# Patient Record
Sex: Female | Born: 1990 | Race: Black or African American | Hispanic: No | Smoking: Never smoker
Health system: Southern US, Community
[De-identification: ages and names within clinical notes are randomized; demographics above are authoritative.]

## PROBLEM LIST (undated history)

## (undated) DIAGNOSIS — N920 Excessive and frequent menstruation with regular cycle: Secondary | ICD-10-CM

## (undated) HISTORY — PX: OTHER SURGICAL HISTORY: SHX169

## (undated) HISTORY — PX: NO PAST SURGERIES: SHX2092

---
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
## 2010-07-30 ENCOUNTER — Emergency Department: Payer: Self-pay | Admitting: Emergency Medicine

## 2011-02-18 DIAGNOSIS — N920 Excessive and frequent menstruation with regular cycle: Secondary | ICD-10-CM

## 2011-02-18 HISTORY — DX: Excessive and frequent menstruation with regular cycle: N92.0

## 2014-05-27 ENCOUNTER — Emergency Department
Admit: 2014-05-27 | Disposition: A | Discharge status: Home / Self Care | Payer: Self-pay | Admitting: Emergency Medicine

## 2014-05-27 LAB — COMPREHENSIVE METABOLIC PANEL
ALBUMIN: 4.2 g/dL
ALT: 29 U/L
ANION GAP: 8 (ref 7–16)
Alkaline Phosphatase: 66 U/L
BILIRUBIN TOTAL: 0.7 mg/dL
BUN: 14 mg/dL
CO2: 25 mmol/L
CREATININE: 0.65 mg/dL
Calcium, Total: 9.3 mg/dL
Chloride: 105 mmol/L
EGFR (African American): >60
EGFR (Non-African Amer.): >60
GLUCOSE: 79 mg/dL
POTASSIUM: 3.8 mmol/L
SGOT(AST): 14 U/L — ABNORMAL LOW
Sodium: 138 mmol/L
TOTAL PROTEIN: 7.5 g/dL

## 2014-05-27 LAB — CBC
HCT: 45.3 % (ref 35.0–47.0)
HGB: 14.7 g/dL (ref 12.0–16.0)
MCH: 29.5 pg (ref 26.0–34.0)
MCHC: 32.5 g/dL (ref 32.0–36.0)
MCV: 91 fL (ref 80–100)
PLATELETS: 314 10*3/uL (ref 150–440)
RBC: 4.99 10*6/uL (ref 3.80–5.20)
RDW: 12.7 % (ref 11.5–14.5)
WBC: 10.4 10*3/uL (ref 3.6–11.0)

## 2014-05-27 LAB — URINALYSIS, COMPLETE
Bacteria: NONE SEEN
Bilirubin,UR: NEGATIVE
Blood: NEGATIVE
Glucose,UR: NEGATIVE mg/dL (ref 0–75)
LEUKOCYTE ESTERASE: NEGATIVE
NITRITE: NEGATIVE
PH: 5 (ref 4.5–8.0)
PROTEIN: NEGATIVE
SPECIFIC GRAVITY: 1.027 (ref 1.003–1.030)

## 2014-05-27 LAB — LIPASE, BLOOD: Lipase: 46 U/L

## 2015-03-22 ENCOUNTER — Emergency Department
Admission: EM | Admit: 2015-03-22 | Discharge: 2015-03-22 | Disposition: A | Discharge status: Home / Self Care | Payer: Self-pay | Attending: Emergency Medicine | Admitting: Emergency Medicine

## 2015-03-22 ENCOUNTER — Encounter: Payer: Self-pay | Admitting: Emergency Medicine

## 2015-03-22 DIAGNOSIS — F17200 Nicotine dependence, unspecified, uncomplicated: Secondary | ICD-10-CM | POA: Insufficient documentation

## 2015-03-22 DIAGNOSIS — Y998 Other external cause status: Secondary | ICD-10-CM | POA: Insufficient documentation

## 2015-03-22 DIAGNOSIS — T23102A Burn of first degree of left hand, unspecified site, initial encounter: Secondary | ICD-10-CM

## 2015-03-22 DIAGNOSIS — X12XXXA Contact with other hot fluids, initial encounter: Secondary | ICD-10-CM | POA: Insufficient documentation

## 2015-03-22 DIAGNOSIS — Y9389 Activity, other specified: Secondary | ICD-10-CM | POA: Insufficient documentation

## 2015-03-22 DIAGNOSIS — T23162A Burn of first degree of back of left hand, initial encounter: Secondary | ICD-10-CM | POA: Insufficient documentation

## 2015-03-22 DIAGNOSIS — T23172A Burn of first degree of left wrist, initial encounter: Secondary | ICD-10-CM | POA: Insufficient documentation

## 2015-03-22 DIAGNOSIS — T22112A Burn of first degree of left forearm, initial encounter: Secondary | ICD-10-CM | POA: Insufficient documentation

## 2015-03-22 DIAGNOSIS — Y9289 Other specified places as the place of occurrence of the external cause: Secondary | ICD-10-CM | POA: Insufficient documentation

## 2015-03-22 LAB — HM PAP SMEAR: HM Pap smear: NEGATIVE

## 2015-03-22 MED ORDER — IBUPROFEN 600 MG PO TABS: 600.0000 mg | ORAL_TABLET | Freq: Once | ORAL | Status: DC

## 2015-03-22 MED ORDER — SILVER SULFADIAZINE 1 % EX CREA
TOPICAL_CREAM | Freq: Once | CUTANEOUS | Status: AC
  Administered 2015-03-22: 08:00:00 via TOPICAL
  Filled 2015-03-22: qty 85

## 2015-03-22 MED ORDER — HYDROCODONE-ACETAMINOPHEN 5-325 MG PO TABS: 1.0000 | ORAL_TABLET | ORAL | Status: DC | PRN

## 2015-03-22 MED ORDER — IBUPROFEN 800 MG PO TABS: 800.0000 mg | ORAL_TABLET | Freq: Three times a day (TID) | ORAL | Status: DC

## 2015-03-22 MED ORDER — IBUPROFEN 600 MG PO TABS
600.0000 mg | ORAL_TABLET | Freq: Once | ORAL | Status: AC
  Administered 2015-03-22: 600 mg via ORAL
  Filled 2015-03-22: qty 1

## 2015-03-22 NOTE — Discharge Instructions (Signed)
Burn Care Burns hurt your skin. When your skin is hurt, it is easier to get an infection. Follow your doctor's directions to help prevent an infection. HOME CARE  Wash your hands well before you change your bandage.  Change your bandage as often as told by your doctor.  Remove the old bandage. If the bandage sticks, soak it off with cool, clean water.  Gently clean the burn with mild soap and water.  Pat the burn dry with a clean, dry cloth.  Put a thin layer of medicated cream on the burn.  Put a clean bandage on as told by your doctor.  Keep the bandage clean and dry.  Raise (elevate) the burn for the first 24 hours. After that, follow your doctor's directions.  Only take medicine as told by your doctor. GET HELP RIGHT AWAY IF:   You have too much pain.  The skin near the burn is red, tender, puffy (swollen), or has red streaks.  The burn area has yellowish white fluid (pus) or a bad smell coming from it.  You have a fever. MAKE SURE YOU:   Understand these instructions.  Will watch your condition.  Will get help right away if you are not doing well or get worse.   This information is not intended to replace advice given to you by your health care provider. Make sure you discuss any questions you have with your health care provider.   Document Released: 11/13/2007 Document Revised: 04/28/2011 Document Reviewed: 06/26/2010 Elsevier Interactive Patient Education 2016 [employer].   Begin taking ibuprofen with food 3 times a day for inflammation. Take Norco if needed for pain. Your burn will need to be reassessed in 24 hours. You will need to follow-up with [facility] clinic or return to the emergency room for evaluation and redressing. Keep bandage clean and dry.
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
## 2015-03-22 NOTE — ED Notes (Signed)
Reddened forearm mid arm down to wrist.  No blistering noted.  Cool wet cloth applied.

## 2015-03-23 ENCOUNTER — Encounter: Payer: Self-pay | Admitting: Emergency Medicine

## 2015-03-23 ENCOUNTER — Emergency Department
Admission: EM | Admit: 2015-03-23 | Discharge: 2015-03-23 | Disposition: A | Discharge status: Home / Self Care | Payer: Self-pay | Attending: Emergency Medicine | Admitting: Emergency Medicine

## 2015-03-23 DIAGNOSIS — Y9289 Other specified places as the place of occurrence of the external cause: Secondary | ICD-10-CM | POA: Insufficient documentation

## 2015-03-23 DIAGNOSIS — T23272D Burn of second degree of left wrist, subsequent encounter: Secondary | ICD-10-CM | POA: Insufficient documentation

## 2015-03-23 DIAGNOSIS — Y279XXD Contact with unspecified hot objects, undetermined intent, subsequent encounter: Secondary | ICD-10-CM | POA: Insufficient documentation

## 2015-03-23 DIAGNOSIS — F17200 Nicotine dependence, unspecified, uncomplicated: Secondary | ICD-10-CM | POA: Insufficient documentation
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
## 2015-03-23 NOTE — Discharge Instructions (Signed)
Second-Degree Burn °A second-degree burn affects the 2 outer layers of skin. The outer layer (epidermis) and the layer underneath it (dermis) are both burned. Another name for this type of burn is a partial thickness burn. A second-degree burn may be called minor or major. This depends on the size of the burn. It also depends on what parts of the skin are burned. Minor burns may be treated with first aid. Major burns are a medical emergency. °A second-degree burn is worse than a first-degree burn, but not as bad as a third-degree burn. A first-degree burn affects only the epidermis. A third-degree burn goes through all the layers of skin. A second-degree burn usually heals in 3 to 4 weeks. A minor second-degree burn usually does not leave a scar. Deeper second-degree burns may lead to scarring of the skin or contractures over joints. Contractures are scars that form over joints and may lead to reduced mobility at those joints. °CAUSES °· Heat (thermal) injury. This happens when skin comes in contact with something very hot. It could be a flame, a hot object, hot liquid, or steam. Most second-degree burns are thermal injuries. °· Radiation. Sunlight is one type of radiation that can burn the skin. Another type of radiation is used to heat food. Radiation is also used to treat some diseases, such as cancer. All types of radiation can burn the skin. Sunlight usually causes a first-degree burn. Radiation used for heating food or treating a disease can cause a second-degree burn. °· Electricity. Electrical burns can cause more damage under the skin than on the surface. They should always be treated as major burns. °· Chemicals. Many chemicals can burn the skin. The burn should be flushed with cool water and checked by an emergency caregiver. °SYMPTOMS °Symptoms of second-degree burns include: °· Severe pain. °· Extreme tenderness. °· Deep redness. °· Blistered skin. °· Skin that has changed color. It might look blotchy,  wet, or shiny. °· Swelling. °TREATMENT °Some second-degree burns may need to be treated in a hospital. These include major burns, electrical burns, and chemical burns. Many other second-degree burns can be treated with regular first aid, such as: °· Cooling the burn. Use cool, germ-free (sterile) salt water. Place the burned area of skin into a tub of water, or cover the burned area with clean, wet towels. °· Taking pain medicine. °· Removing the dead skin from broken blisters. A trained caregiver may do this. Do not pop blisters. °· Gently washing your skin with mild soap. °· Covering the burned area with a cream. Silver sulfadiazine is a cream for burns. An antibiotic cream, such as bacitracin, may also be used to fight infection. Do not use other ointments or creams unless your caregiver says it is okay. °· Protecting the burn with a sterile, non-sticky bandage. °· Bandaging fingers and toes separately. This keeps them from sticking together. °· Taking an antibiotic. This can help prevent infection. °· Getting a tetanus shot. °HOME CARE INSTRUCTIONS °Medication °· Take any medicine prescribed by your caregiver. Follow the directions carefully. °· Ask your caregiver if you can take over-the-counter medicine to relieve pain and swelling. Do not give aspirin to children. °· Make sure your caregiver knows about all other medicines you take. This includes over-the-counter medicines. °Burn care °· You will need to change the bandage on your burn. You may need to do this 2 or 3 times each day. °¨ Gently clean the burned area. °¨ Put ointment on it. °¨ Cover the burn with a sterile bandage. °·   For some deeper burns or burns that cover a large area, compression garments may be prescribed. These garments can help minimize scarring and protect your mobility. °· Do not put butter or oil on your skin. Use only the cream prescribed by your caregiver. °· Do not put ice on your burn. °· Do not break blisters on your  skin. °· Keep the bandaged area dry. You might need to take a sponge bath for awhile. Ask your caregiver when you can take a shower or a tub bath again. °· Do not scratch an itchy burn. Your caregiver may give you medicine to relieve very bad itching. °· Infection is a big danger after a second-degree burn. Tell your caregiver right away if you have signs of infection, such as: °¨ Redness or changing color in the burned area. °¨ Fluid leaking from the burn. °¨ Swelling in the burn area. °¨ A bad smell coming from the wound. °Follow-up °· Keep all follow-up appointments. This is important. This is how your caregiver can tell if your treatment is working. °· Protect your burn from sunlight. Use sunscreen whenever you go outside. Burned areas may be sensitive to the sun for up to 1 year. Exposure to the sun may also cause permanent darkening of scars. °SEEK MEDICAL CARE IF: °· You have any questions about medicines. °· You have any questions about your treatment. °· You wonder if it is okay to do a particular activity. °· You develop a fever of more than 100.5° F (38.1° C). °SEEK IMMEDIATE MEDICAL CARE IF: °· You think your burn might be infected. It may change color, become red, leak fluid, swell, or smell bad. °· You develop a fever of more than 102° F (38.9° C). °  °This information is not intended to replace advice given to you by your health care provider. Make sure you discuss any questions you have with your health care provider. °  °Document Released: 07/08/2010 Document Revised: 04/28/2011 Document Reviewed: 07/08/2010 °Elsevier Interactive Patient Education ©2016 Elsevier Inc[employer] ° °
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
## 2018-05-19 ENCOUNTER — Other Ambulatory Visit (HOSPITAL_COMMUNITY): Payer: Self-pay | Admitting: Family Medicine

## 2018-05-19 LAB — WET PREP FOR TRICH, YEAST, CLUE
Trichomonas Exam: NEGATIVE
Yeast Exam: NEGATIVE

## 2018-05-19 LAB — HM HIV SCREENING LAB: HM HIV Screening: NEGATIVE

## 2018-11-03 ENCOUNTER — Ambulatory Visit: Payer: Self-pay

## 2018-11-05 ENCOUNTER — Ambulatory Visit: Payer: Self-pay

## 2019-01-26 ENCOUNTER — Ambulatory Visit: Payer: Self-pay | Admitting: Family Medicine

## 2019-01-26 ENCOUNTER — Encounter: Payer: Self-pay | Admitting: Family Medicine

## 2019-01-26 ENCOUNTER — Other Ambulatory Visit: Payer: Self-pay

## 2019-01-26 DIAGNOSIS — Z113 Encounter for screening for infections with a predominantly sexual mode of transmission: Secondary | ICD-10-CM

## 2019-01-26 LAB — WET PREP FOR TRICH, YEAST, CLUE
Trichomonas Exam: NEGATIVE
Yeast Exam: NEGATIVE

## 2019-01-26 NOTE — Progress Notes (Signed)
    STI clinic/screening visit  Subjective:  Stacy Palmer is a 28 y.o. female being seen today for an STI screening visit. The patient reports they do have symptoms.  Patient reports that they do not desire a pregnancy in the next year.   They reported they are not interested in discussing contraception today.   Patient has the following medical conditions:  There are no active problems to display for this patient.    Chief Complaint  Patient presents with  . SEXUALLY TRANSMITTED DISEASE    HPI  Patient reports she states she has a white/yellow disch with odor x 5 days.  States she has a h/o BV. States that she stopped Depo because her partners ar now female.  See flowsheet for further details and programmatic requirements.   The following portions of the patient's history were reviewed and updated as appropriate: allergies, current medications, past medical history, past social history, past surgical history and problem list.  Objective:  There were no vitals filed for this visit.  Physical Exam HENT:     Mouth/Throat:     Mouth: Mucous membranes are moist.     Pharynx: Oropharynx is clear. No oropharyngeal exudate or posterior oropharyngeal erythema.  Neck:     Musculoskeletal: Neck supple. No muscular tenderness.  Abdominal:     General: There is no distension.     Palpations: Abdomen is soft.     Tenderness: There is no abdominal tenderness.  Genitourinary:    General: Normal vulva.     Vagina: Vaginal discharge present.     Comments: sm amount white dsich, pH<4.5 Bimanual not indicated Lymphadenopathy:     Cervical: No cervical adenopathy.  Skin:    General: Skin is warm and dry.     Findings: No lesion or rash.  Neurological:     Mental Status: She is alert.    Assessment and Plan:  Stacy Palmer is a 28 y.o. female presenting to the [facility] Department for STI screening  1. Screening examination for venereal disease  - WET PREP FOR TRICH,  YEAST, CLUE - Gonococcus culture - HIV [place] [facility] - Syphilis [facility] Treat wet prep as per SO. Co to use dental dams or condoms cut in half for STD prevention    No follow-ups on file.  No future appointments.  Hassell Done, FNP

## 2019-01-26 NOTE — Progress Notes (Signed)
Wet mount reviewed and is negative today, so no treatment needed for wet mount per standing order and per Hassell Done, FNP verbal order. Provider orders completed.Ronny Bacon, RN

## 2019-01-31 LAB — GONOCOCCUS CULTURE

## 2019-03-03 NOTE — Addendum Note (Signed)
Addended by: Geanie Berlin on: 03/03/2019 08:28 AM   Modules accepted: Orders

## 2019-12-12 ENCOUNTER — Other Ambulatory Visit: Payer: Self-pay

## 2019-12-12 ENCOUNTER — Ambulatory Visit: Payer: Self-pay | Admitting: Physician Assistant

## 2019-12-12 ENCOUNTER — Encounter: Payer: Self-pay | Admitting: Physician Assistant

## 2019-12-12 DIAGNOSIS — Z113 Encounter for screening for infections with a predominantly sexual mode of transmission: Secondary | ICD-10-CM

## 2019-12-12 LAB — WET PREP FOR TRICH, YEAST, CLUE
Trichomonas Exam: NEGATIVE
Yeast Exam: NEGATIVE

## 2019-12-12 NOTE — Progress Notes (Signed)
[facility] Department STI clinic/screening visit  Subjective:  Stepheni Cameron is a 29 y.o. female being seen today for an STI screening visit. The patient reports they do have symptoms.  Patient reports that they do not desire a pregnancy in the next year.   They reported they are not interested in discussing contraception today.  Patient's last menstrual period was 11/22/2019.   Patient has the following medical conditions:  There are no problems to display for this patient.   Chief Complaint  Patient presents with  . SEXUALLY TRANSMITTED DISEASE    screening    HPI  Patient reports that she has noticed a thick, white discharge for about 1 week.  Denies other symptoms, chronic conditions, surgeries and regular medicines.  States previously used Depo as [facility] but her last one was in the summer of 2020.  Using condoms sometimes as current BCM.   See flowsheet for further details and programmatic requirements.    The following portions of the patient's history were reviewed and updated as appropriate: allergies, current medications, past medical history, past social history, past surgical history and problem list.  Objective:  There were no vitals filed for this visit.  Physical Exam Constitutional:      General: She is not in acute distress.    Appearance: Normal appearance.  HENT:     Head: Normocephalic and atraumatic.     Comments: No nits,lice, or hair loss. No cervical, supraclavicular or axillary adenopathy.    Mouth/Throat:     Mouth: Mucous membranes are moist.     Pharynx: Oropharynx is clear. No oropharyngeal exudate or posterior oropharyngeal erythema.  Eyes:     Conjunctiva/sclera: Conjunctivae normal.  Pulmonary:     Effort: Pulmonary effort is normal.  Abdominal:     Palpations: Abdomen is soft. There is no mass.     Tenderness: There is no abdominal tenderness. There is no guarding or rebound.  Genitourinary:    General: Normal vulva.      Rectum: Normal.     Comments: External genitalia/pubic area without nits, lice, edema, erythema, lesions and inguinal adenopathy. Vagina with normal mucosa and discharge. Cervix without visible lesions. Uterus firm, mobile, nt, no masses, no CMT, no adnexal tenderness or fullness. Musculoskeletal:     Cervical back: Neck supple. No tenderness.  Skin:    General: Skin is warm and dry.     Findings: No bruising, erythema, lesion or rash.  Neurological:     Mental Status: She is alert and oriented to person, place, and time.  Psychiatric:        Mood and Affect: Mood normal.        Thought Content: Thought content normal.        Judgment: Judgment normal.      Assessment and Plan:  Niara Bunker is a 30 y.o. female presenting to the [facility] Department for STI screening  1. Screening for STD (sexually transmitted disease) Patient into clinic with symptoms. Reviewed with patient that wet mount is normal today and no treatment is indicated. Reviewed with patient that she should take MVI 1 po daily for folic acid in case a pregnancy were to occur. Rec condoms with all sex. Await test results.  Counseled that RN will call if needs to RTC for treatment once results are back. - WET PREP FOR TRICH, YEAST, CLUE - Chlamydia/Gonorrhea [place] [facility] - HIV [place] [facility] - Syphilis Serology, [place] [facility] - Gonococcus culture  No follow-ups on file.  No future appointments.  Jerene Dilling, PA

## 2019-12-17 LAB — GONOCOCCUS CULTURE

## 2019-12-30 ENCOUNTER — Ambulatory Visit (LOCAL_COMMUNITY_HEALTH_CENTER): Payer: Self-pay

## 2019-12-30 ENCOUNTER — Other Ambulatory Visit: Payer: Self-pay

## 2019-12-30 VITALS — BP 106/69 | Ht 62.0 in | Wt 150.0 lb

## 2019-12-30 DIAGNOSIS — Z3201 Encounter for pregnancy test, result positive: Secondary | ICD-10-CM

## 2019-12-30 LAB — PREGNANCY, URINE: Preg Test, Ur: POSITIVE — AB

## 2019-12-30 MED ORDER — PRENATAL 27-0.8 MG PO TABS: 1.0000 | ORAL_TABLET | Freq: Every day | ORAL | 0 refills | Status: AC

## 2019-12-30 NOTE — Progress Notes (Signed)
UPT positive today. Unsure where she will go for prenatal care. Local prenatal provider resource sheet given. Reports vaginal spotting last week and this week. Consult Maximiano Coss, PA-C who says this can be normal early in pregnancy but should seek immediate medical attention if bleeding increases and/or accompanied with pain. RN discussed provider recommendation. Pt in agreement. Reports understanding and questions answered. Jerel Shepherd, RN

## 2019-12-30 NOTE — Progress Notes (Signed)
I was consulted and agree with RN Siobhan Greene's documentation.

## 2020-01-30 ENCOUNTER — Ambulatory Visit (INDEPENDENT_AMBULATORY_CARE_PROVIDER_SITE_OTHER): Payer: Medicaid Other | Admitting: Advanced Practice Midwife

## 2020-01-30 ENCOUNTER — Ambulatory Visit (INDEPENDENT_AMBULATORY_CARE_PROVIDER_SITE_OTHER): Payer: Medicaid Other

## 2020-01-30 ENCOUNTER — Other Ambulatory Visit (HOSPITAL_COMMUNITY)
Admission: RE | Admit: 2020-01-30 | Discharge: 2020-01-30 | Disposition: A | Discharge status: Home / Self Care | Payer: Medicaid Other | Source: Ambulatory Visit | Attending: Advanced Practice Midwife | Admitting: Advanced Practice Midwife

## 2020-01-30 ENCOUNTER — Other Ambulatory Visit: Payer: Self-pay

## 2020-01-30 ENCOUNTER — Encounter: Payer: Self-pay | Admitting: Advanced Practice Midwife

## 2020-01-30 VITALS — BP 110/70 | Ht 62.0 in | Wt 150.6 lb

## 2020-01-30 DIAGNOSIS — Z113 Encounter for screening for infections with a predominantly sexual mode of transmission: Secondary | ICD-10-CM | POA: Insufficient documentation

## 2020-01-30 DIAGNOSIS — Z124 Encounter for screening for malignant neoplasm of cervix: Secondary | ICD-10-CM

## 2020-01-30 DIAGNOSIS — Z1379 Encounter for other screening for genetic and chromosomal anomalies: Secondary | ICD-10-CM

## 2020-01-30 DIAGNOSIS — Z3401 Encounter for supervision of normal first pregnancy, first trimester: Secondary | ICD-10-CM

## 2020-01-30 DIAGNOSIS — Z3A09 9 weeks gestation of pregnancy: Secondary | ICD-10-CM

## 2020-01-30 LAB — POCT URINALYSIS DIPSTICK OB
Glucose, UA: NEGATIVE
POC,PROTEIN,UA: NEGATIVE

## 2020-01-30 NOTE — Progress Notes (Signed)
New Obstetric Patient H&P    Chief Complaint: "Desires prenatal care"   History of Present Illness: Patient is a 29 y.o. G1P0000 Not Hispanic or Latino female, presents with amenorrhea and positive home pregnancy test. Patient's last menstrual period was 11/22/2019 (exact date). and based on her  LMP, her EDD is Estimated Date of Delivery: 08/28/20 and her EGA is [redacted]w[redacted]d. Cycles are 7 days, regular, and occur approximately every : 28 days. Her last pap smear was in the past few years per her report years ago and was no abnormalities. PAP today.   She had a urine pregnancy test which was positive 1 month(s)  ago. Her last menstrual period was normal and lasted for  7 day(s). Since her LMP she claims she has experienced breast tenderness, fatigue, nausea, vomiting. She denies vaginal bleeding. Her past medical history is noncontributory. This is her first pregnancy.  Since her LMP, she admits to the use of tobacco products  She quit smoking with positive UPT She claims she has gained  no pounds since the start of her pregnancy.  There are cats in the home in the home  no  She admits close contact with children on a regular basis  no  She has had chicken pox in the past yes She has had Tuberculosis exposures, symptoms, or previously tested positive for TB   no Current or past history of domestic violence. no  Genetic Screening/Teratology Counseling: (Includes patient, baby's father, or anyone in either family with:)   1. Patient's age >/= 79 at [facility]  no 2. Thalassemia ([place], [place], Mediterranean, or Asian background): MCV<80  no 3. Neural tube defect (meningomyelocele, spina bifida, anencephaly)  no 4. Congenital heart defect  no  5. Down syndrome  no 6. Tay-Sachs (Jewish, [place]  no 7. Canavan's Disease  no 8. Sickle cell disease or trait (African)  no  9. Hemophilia or other blood disorders  no  10. Muscular dystrophy  no  11. Cystic fibrosis  no  12. Huntington's Chorea  no   13. Mental retardation/autism  no 14. Other inherited genetic or chromosomal disorder  no 15. Maternal metabolic disorder (DM, PKU, etc)  no 16. Patient or FOB with a child with a birth defect not listed above no  16a. Patient or FOB with a birth defect themselves no 17. Recurrent pregnancy loss, or stillbirth  no  18. Any medications since LMP other than prenatal vitamins (include vitamins, supplements, OTC meds, drugs, alcohol)  no 19. Any other genetic/environmental exposure to discuss  no  Infection History:   1. Lives with someone with TB or TB exposed  no  2. Patient or partner has history of genital herpes  no 3. Rash or viral illness since LMP  no 4. History of STI (GC, CT, HPV, syphilis, HIV)  no 5. History of recent travel :  no  Other pertinent information:  Patient is an identical twin, family history of twins    Review of Systems:10 point review of systems negative unless otherwise noted in HPI  Past Medical History:  Patient Active Problem List   Diagnosis Date Noted  . Encounter for supervision of normal first pregnancy in first trimester 01/30/2020    Clinic [facility] Prenatal Labs  Dating EDD by LMP = 9w u/s Blood type:     Genetic Screen 1 Screen:    AFP:     Quad:     NIPS: Antibody:   Anatomic [place]  Rubella:    Varicella: @  VZVIGG@  GTT Early: NA               Third trimester:  RPR:     Rhogam  HBsAg:     Vaccines TDAP:                       Flu Shot: Covid: HIV:     Baby Food Leaning towards formula                            GBS:   GC/CT:  Contraception  Pap: 01/30/20  CBB     CS/VBAC NA   Support Person [name]         Past Surgical History:  Past Surgical History:  Procedure Laterality Date  . denies      Gynecologic History: Patient's last menstrual period was 11/22/2019 (exact date).  Obstetric History: G1P0000  Family History:  Family History  Problem Relation Age of Onset  . Hypertension Mother   . Migraines Mother   . Diabetes  Mother   . Hypertension Maternal Grandmother   . Hypertension Paternal Grandmother     Social History:  Social History   Socioeconomic History  . Marital status: Single    Spouse name: Not on file  . Number of children: Not on file  . Years of education: Not on file  . Highest education level: Not on file  Occupational History  . Not on file  Tobacco Use  . Smoking status: Never Smoker  . Smokeless tobacco: Never Used  Vaping Use  . Vaping Use: Never used  Substance and Sexual Activity  . Alcohol use: Not Currently    Comment: last use- 12/24/19 tequila  . Drug use: Not Currently    Types: Marijuana    Comment: hx marijuana - stopped 02/2019  . Sexual activity: Yes    Partners: Female    Birth control/protection: None  Other Topics Concern  . Not on file  Social History Narrative  . Not on file   Social Determinants of Health   Financial Resource Strain: Not on file  Food Insecurity: Not on file  Transportation Needs: Not on file  Physical Activity: Not on file  Stress: Not on file  Social Connections: Not on file  Intimate Partner Violence: Not At Risk  . Fear of Current or Ex-Partner: No  . Emotionally Abused: No  . Physically Abused: No  . Sexually Abused: No    Allergies:  No Known Allergies  Medications: Prior to Admission medications   Medication Sig Start Date End Date Taking? Authorizing Provider  Prenatal Vit-Fe Fumarate-FA (MULTIVITAMIN-PRENATAL) 27-0.8 MG TABS tablet Take 1 tablet by mouth daily at 12 noon. 12/30/19 04/08/20  Federico Flake, MD    Physical Exam Vitals: Blood pressure 110/70, height 5\' 2"  (1.575 m), weight 150 lb 9.6 oz (68.3 kg), last menstrual period 11/22/2019.  General: NAD HEENT: normocephalic, anicteric Thyroid: no enlargement, no palpable nodules Pulmonary: No increased work of breathing, CTAB Cardiovascular: RRR, distal pulses 2+ Abdomen: NABS, soft, non-tender, non-distended.  Umbilicus without lesions.  No  hepatomegaly, splenomegaly or masses palpable. No evidence of hernia  Genitourinary:  External: Normal external female genitalia.  Normal urethral meatus, normal Bartholin's and Skene's glands.    Vagina: Normal vaginal mucosa, no evidence of prolapse.    Cervix: Grossly normal in appearance, no bleeding, no CMT  Uterus: Non-enlarged, mobile, normal contour.    Adnexa: ovaries non-enlarged,  no adnexal masses  Rectal: deferred Extremities: no edema, erythema, or tenderness Neurologic: Grossly intact Psychiatric: mood appropriate, affect full  Dating scan today: equals LMP, no adjustment of EDD  The following were addressed during this visit:  Breastfeeding Education - Early initiation of breastfeeding    Comments: Keeps milk supply adequate, helps contract uterus and slow bleeding, and early milk is the perfect first food and is easy to digest.   - The importance of exclusive breastfeeding    Comments: Provides antibodies, Lower risk of breast and ovarian cancers, and type-2 diabetes,Helps your body recover, Reduced chance of SIDS.   - Risks of giving your baby anything other than breast milk if you are breastfeeding    Comments: Make the baby less content with breastfeeds, may make my baby more susceptible to illness, and may reduce my milk supply.   - The importance of early skin-to-skin contact    Comments: Keeps baby warm and secure, helps keep baby's blood sugar up and breathing steady, easier to bond and breastfeed, and helps calm baby.  - Rooming-in on a 24-hour basis    Comments: Easier to learn baby's feeding cues, easier to bond and get to know each other, and encourages milk production.   - Feeding on demand or baby-led feeding    Comments: Helps prevent breastfeeding complications, helps bring in good milk supply, prevents under or overfeeding, and helps baby feel content and satisfied   - Frequent feeding to help assure optimal milk production    Comments: Making  a full supply of milk requires frequent removal of milk from breasts, infant will eat 8-12 times in 24 hours, if separated from infant use breast massage, hand expression and/ or pumping to remove milk from breasts.   - Effective positioning and attachment    Comments: Helps my baby to get enough breast milk, helps to produce an adequate milk supply, and helps prevent nipple pain and damage   - Exclusive breastfeeding for the first 6 months    Comments: Builds a healthy milk supply and keeps it up, protects baby from sickness and disease, and breastmilk has everything your baby needs for the first 6 months.  - Individualized Education    Comments: Contraindications to breastfeeding and other special medical condition Patient states she does not want to breastfeed. She was open to listening to basics and benefits. Also she agrees to take RSB online class.     Assessment: 29 y.o. G1P0000 at [redacted]w[redacted]d presenting to initiate prenatal care  Plan: 1) Avoid alcoholic beverages. 2) Patient encouraged not to smoke.  3) Discontinue the use of all non-medicinal drugs and chemicals.  4) Take prenatal vitamins daily.  5) Nutrition, food safety (fish, cheese advisories, and high nitrite foods) and exercise discussed. 6) Hospital and practice style discussed with cross coverage system.  7) Genetic Screening, such as with 1st Trimester Screening, cell free fetal DNA, AFP testing, and Ultrasound, as well as with amniocentesis and CVS as appropriate, is discussed with patient. At the conclusion of today's visit patient requested genetic testing 8) Patient is asked about travel to areas at risk for the Zika virus, and counseled to avoid travel and exposure to mosquitoes or sexual partners who may have themselves been exposed to the virus. Testing is discussed, and will be ordered as appropriate.  9) PAPtima, urine culture, NOB panel, sickle cell screen, MaterniT 21, dating scan today 10) Return to clinic in 4  weeks for ROB   Tresea Mall, CNM [facility] 01/30/2020, 4:49 PM

## 2020-01-30 NOTE — Patient Instructions (Signed)
Exercise During Pregnancy Exercise is an important part of being healthy for people of all ages. Exercise improves the function of your heart and lungs and helps you maintain strength, flexibility, and a healthy body weight. Exercise also boosts energy levels and elevates mood. Most women should exercise regularly during pregnancy. In rare cases, women with certain medical conditions or complications may be asked to limit or avoid exercise during pregnancy. How does this affect me? Along with maintaining general strength and flexibility, exercising during pregnancy can help:  Keep strength in muscles that are used during labor and childbirth.  Decrease low back pain.  Reduce symptoms of depression.  Control weight gain during pregnancy.  Reduce the risk of needing insulin if you develop diabetes during pregnancy.  Decrease the risk of cesarean delivery.  Speed up your recovery after giving birth. How does this affect my baby? Exercise can help you have a healthy pregnancy. Exercise does not cause premature birth. It will not cause your baby to weigh less at birth. What exercises can I do? Many exercises are safe for you to do during pregnancy. Do a variety of exercises that safely increase your heart and breathing rates and help you build and maintain muscle strength. Do exercises exactly as told by your health care provider. You may do these exercises:  Walking or hiking.  Swimming.  Water aerobics.  Riding a stationary bike.  Strength training.  Modified yoga or Pilates. Tell your instructor that you are pregnant. Avoid overstretching, and avoid lying on your back for long periods of time.  Running or jogging. Only choose this type of exercise if you: ? Ran or jogged regularly before your pregnancy. ? Can run or jog and still talk in complete sentences. What exercises should I avoid? Depending on your level of fitness and whether you exercised regularly before your  pregnancy, you may be told to limit high-intensity exercise. You can tell that you are exercising at a high intensity if you are breathing much harder and faster and cannot hold a conversation while exercising. You must avoid:  Contact sports.  Activities that put you at risk for falling on or being hit in the belly, such as downhill skiing, water skiing, surfing, rock climbing, cycling, gymnastics, and horseback riding.  Scuba diving.  Skydiving.  Yoga or Pilates in a room that is heated to high temperatures.  Jogging or running, unless you ran or jogged regularly before your pregnancy. While jogging or running, you should always be able to talk in full sentences. Do not run or jog so fast that you are unable to have a conversation.  Do not exercise at more than 6,000 feet above sea level (high elevation) if you are not used to exercising at high elevation. How do I exercise in a safe way?   Avoid overheating. Do not exercise in very high temperatures.  Wear loose-fitting, breathable clothes.  Avoid dehydration. Drink enough water before, during, and after exercise to keep your urine pale yellow.  Avoid overstretching. Because of hormone changes during pregnancy, it is easy to overstretch muscles, tendons, and ligaments during pregnancy.  Start slowly and ask your health care provider to recommend the types of exercise that are safe for you.  Do not exercise to lose weight. Follow these instructions at home:  Exercise on most days or all days of the week. Try to exercise for 30 minutes a day, 5 days a week, unless your health care provider tells you not to.  If   you actively exercised before your pregnancy and you are healthy, your health care provider may tell you to continue to do moderate to high-intensity exercise.  If you are just starting to exercise or did not exercise much before your pregnancy, your health care provider may tell you to do low to moderate-intensity  exercise. Questions to ask your health care provider  Is exercise safe for me?  What are signs that I should stop exercising?  Does my health condition mean that I should not exercise during pregnancy?  When should I avoid exercising during pregnancy? Stop exercising and contact a health care provider if: You have any unusual symptoms, such as:  Mild contractions of the uterus or cramps in the abdomen.  Dizziness that does not go away when you rest. Stop exercising and get help right away if: You have any unusual symptoms, such as:  Sudden, severe pain in your low back or your belly.  Mild contractions of the uterus or cramps in the abdomen that do not improve with rest and drinking fluids.  Chest pain.  Bleeding or fluid leaking from your vagina.  Shortness of breath. These symptoms may represent a serious problem that is an emergency. Do not wait to see if the symptoms will go away. Get medical help right away. Call your local emergency services (911 in the [place] Do not drive yourself to the hospital. Summary  Most women should exercise regularly throughout pregnancy. In rare cases, women with certain medical conditions or complications may be asked to limit or avoid exercise during pregnancy.  Do not exercise to lose weight during pregnancy.  Your health care provider will tell you what level of physical activity is right for you.  Stop exercising and contact a health care provider if you have mild contractions of the uterus or cramps in the abdomen. Get help right away if these contractions or cramps do not improve with rest and drinking fluids.  Stop exercising and get help right away if you have sudden, severe pain in your low back or belly, chest pain, shortness of breath, or bleeding or leaking of fluid from your vagina. This information is not intended to replace advice given to you by your health care provider. Make sure you discuss any questions you have with your  health care provider. Document Revised: 05/27/2018 Document Reviewed: 03/10/2018 Elsevier Patient Education  2020 [employer] Eating Plan for Pregnant Women While you are pregnant, your body requires additional nutrition to help support your growing baby. You also have a higher need for some vitamins and minerals, such as folic acid, calcium, iron, and vitamin D. Eating a healthy, well-balanced diet is very important for your health and your baby's health. Your need for extra calories varies for the three 3-month segments of your pregnancy (trimesters). For most women, it is recommended to consume:  150 extra calories a day during the first trimester.  300 extra calories a day during the second trimester.  300 extra calories a day during the third trimester. What are tips for following this plan?   Do not try to lose weight or go on a diet during pregnancy.  Limit your overall intake of foods that have "empty calories." These are foods that have little nutritional value, such as sweets, desserts, candies, and sugar-sweetened beverages.  Eat a variety of foods (especially fruits and vegetables) to get a full range of vitamins and minerals.  Take a prenatal vitamin to help meet your additional vitamin and mineral needs   during pregnancy, specifically for folic acid, iron, calcium, and vitamin D.  Remember to stay active. Ask your health care provider what types of exercise and activities are safe for you.  Practice good food safety and cleanliness. Wash your hands before you eat and after you prepare raw meat. Wash all fruits and vegetables well before peeling or eating. Taking these actions can help to prevent food-borne illnesses that can be very dangerous to your baby, such as listeriosis. Ask your health care provider for more information about listeriosis. What does 150 extra calories look like? Healthy options that provide 150 extra calories each day could be any of the  following:  6-8 oz (170-230 g) of plain low-fat yogurt with  cup of berries.  1 apple with 2 teaspoons (11 g) of peanut butter.  Cut-up vegetables with  cup (60 g) of hummus.  8 oz (230 mL) or 1 cup of low-fat chocolate milk.  1 stick of string cheese with 1 medium orange.  1 peanut butter and jelly sandwich that is made with one slice of whole-wheat bread and 1 tsp (5 g) of peanut butter. For 300 extra calories, you could eat two of those healthy options each day. What is a healthy amount of weight to gain? The right amount of weight gain for you is based on your BMI before you became pregnant. If your BMI:  Was less than 18 (underweight), you should gain 28-40 lb (13-18 kg).  Was 18-24.9 (normal), you should gain 25-35 lb (11-16 kg).  Was 25-29.9 (overweight), you should gain 15-25 lb (7-11 kg).  Was 30 or greater (obese), you should gain 11-20 lb (5-9 kg). What if I am having twins or multiples? Generally, if you are carrying twins or multiples:  You may need to eat 300-600 extra calories a day.  The recommended range for total weight gain is 25-54 lb (11-25 kg), depending on your BMI before pregnancy.  Talk with your health care provider to find out about nutritional needs, weight gain, and exercise that is right for you. What foods can I eat?  Fruits All fruits. Eat a variety of colors and types of fruit. Remember to wash your fruits well before peeling or eating. Vegetables All vegetables. Eat a variety of colors and types of vegetables. Remember to wash your vegetables well before peeling or eating. Grains All grains. Choose whole grains, such as whole-wheat bread, oatmeal, or brown rice. Meats and other protein foods Lean meats, including chicken, turkey, fish, and lean cuts of beef, veal, or pork. If you eat fish or seafood, choose options that are higher in omega-3 fatty acids and lower in mercury, such as salmon, herring, mussels, trout, sardines, pollock,  shrimp, crab, and lobster. Tofu. Tempeh. Beans. Eggs. Peanut butter and other nut butters. Make sure that all meats, poultry, and eggs are cooked to food-safe temperatures or "well-done." Two or more servings of fish are recommended each week in order to get the most benefits from omega-3 fatty acids that are found in seafood. Choose fish that are lower in mercury. You can find more information online:  www.fda.gov Dairy Pasteurized milk and milk alternatives (such as almond milk). Pasteurized yogurt and pasteurized cheese. Cottage cheese. Sour cream. Beverages Water. Juices that contain 100% fruit juice or vegetable juice. Caffeine-free teas and decaffeinated coffee. Drinks that contain caffeine are okay to drink, but it is better to avoid caffeine. Keep your total caffeine intake to less than 200 mg each day (which is 12 oz   or 355 mL of coffee, tea, or soda) or the limit as told by your health care provider. Fats and oils Fats and oils are okay to include in moderation. Sweets and desserts Sweets and desserts are okay to include in moderation. Seasoning and other foods All pasteurized condiments. The items listed above may not be a complete list of foods and beverages you can eat. Contact a dietitian for more information. What foods are not recommended? Fruits Unpasteurized fruit juices. Vegetables Raw (unpasteurized) vegetable juices. Meats and other protein foods Lunch meats, bologna, hot dogs, or other deli meats. (If you must eat those meats, reheat them until they are steaming hot.) Refrigerated pat, meat spreads from a meat counter, smoked seafood that is found in the refrigerated section of a store. Raw or undercooked meats, poultry, and eggs. Raw fish, such as sushi or sashimi. Fish that have high mercury content, such as tilefish, shark, swordfish, and king mackerel. To learn more about mercury in fish, talk with your health care provider or look for online resources, such  as:  www.fda.gov Dairy Raw (unpasteurized) milk and any foods that have raw milk in them. Soft cheeses, such as feta, queso blanco, queso fresco, Brie, Camembert cheeses, blue-veined cheeses, and Panela cheese (unless it is made with pasteurized milk, which must be stated on the label). Beverages Alcohol. Sugar-sweetened beverages, such as sodas, teas, or energy drinks. Seasoning and other foods Homemade fermented foods and drinks, such as pickles, sauerkraut, or kombucha drinks. (Store-bought pasteurized versions of these are okay.) Salads that are made in a store or deli, such as ham salad, chicken salad, egg salad, tuna salad, and seafood salad. The items listed above may not be a complete list of foods and beverages you should avoid. Contact a dietitian for more information. Where to find more information To calculate the number of calories you need based on your height, weight, and activity level, you can use an online calculator such as:  www.choosemyplate.gov/MyPlatePlan To calculate how much weight you should gain during pregnancy, you can use an online pregnancy weight gain calculator such as:  www.choosemyplate.gov/pregnancy-weight-gain-calculator Summary  While you are pregnant, your body requires additional nutrition to help support your growing baby.  Eat a variety of foods, especially fruits and vegetables to get a full range of vitamins and minerals.  Practice good food safety and cleanliness. Wash your hands before you eat and after you prepare raw meat. Wash all fruits and vegetables well before peeling or eating. Taking these actions can help to prevent food-borne illnesses, such as listeriosis, that can be very dangerous to your baby.  Do not eat raw meat or fish. Do not eat fish that have high mercury content, such as tilefish, shark, swordfish, and king mackerel. Do not eat unpasteurized (raw) dairy.  Take a prenatal vitamin to help meet your additional vitamin and  mineral needs during pregnancy, specifically for folic acid, iron, calcium, and vitamin D. This information is not intended to replace advice given to you by your health care provider. Make sure you discuss any questions you have with your health care provider. Document Revised: 06/24/2018 Document Reviewed: 10/31/2016 Elsevier Patient Education  2020 [employer] Prenatal Care Prenatal care is health care during pregnancy. It helps you and your unborn baby (fetus) stay as healthy as possible. Prenatal care may be provided by a midwife, a family practice health care provider, or a childbirth and pregnancy specialist (obstetrician). How does this affect me? During pregnancy, you will be closely monitored   for any new conditions that might develop. To lower your risk of pregnancy complications, you and your health care provider will talk about any underlying conditions you have. How does this affect my baby? Early and consistent prenatal care increases the chance that your baby will be healthy during pregnancy. Prenatal care lowers the risk that your baby will be:  Born early (prematurely).  Smaller than expected at birth (small for gestational age). What can I expect at the first prenatal care visit? Your first prenatal care visit will likely be the longest. You should schedule your first prenatal care visit as soon as you know that you are pregnant. Your first visit is a good time to talk about any questions or concerns you have about pregnancy. At your visit, you and your health care provider will talk about:  Your medical history, including: ? Any past pregnancies. ? Your family's medical history. ? The baby's father's medical history. ? Any long-term (chronic) health conditions you have and how you manage them. ? Any surgeries or procedures you have had. ? Any current over-the-counter or prescription medicines, herbs, or supplements you are taking.  Other factors that could pose a risk  to your baby, including:  Your home setting and your stress levels, including: ? Exposure to abuse or violence. ? Household financial strain. ? Mental health conditions you have.  Your daily health habits, including diet and exercise. Your health care provider will also:  Measure your weight, height, and blood pressure.  Do a physical exam, including a pelvic and breast exam.  Perform blood tests and urine tests to check for: ? Urinary tract infection. ? Sexually transmitted infections (STIs). ? Low iron levels in your blood (anemia). ? Blood type and certain proteins on red blood cells (Rh antibodies). ? Infections and immunity to viruses, such as hepatitis B and rubella. ? HIV (human immunodeficiency virus).  Do an ultrasound to confirm your baby's growth and development and to help predict your estimated due date (EDD). This ultrasound is done with a probe that is inserted into the vagina (transvaginal ultrasound).  Discuss your options for genetic screening.  Give you information about how to keep yourself and your baby healthy, including: ? Nutrition and taking vitamins. ? Physical activity. ? How to manage pregnancy symptoms such as nausea and vomiting (morning sickness). ? Infections and substances that may be harmful to your baby and how to avoid them. ? Food safety. ? Dental care. ? Working. ? Travel. ? Warning signs to watch for and when to call your health care provider. How often will I have prenatal care visits? After your first prenatal care visit, you will have regular visits throughout your pregnancy. The visit schedule is often as follows:  Up to week 28 of pregnancy: once every 4 weeks.  28-36 weeks: once every 2 weeks.  After 36 weeks: every week until delivery. Some women may have visits more or less often depending on any underlying health conditions and the health of the baby. Keep all follow-up and prenatal care visits as told by your health care  provider. This is important. What happens during routine prenatal care visits? Your health care provider will:  Measure your weight and blood pressure.  Check for fetal heart sounds.  Measure the height of your uterus in your abdomen (fundal height). This may be measured starting around week 20 of pregnancy.  Check the position of your baby inside your uterus.  Ask questions about your diet, sleeping patterns, and   whether you can feel the baby move.  Review warning signs to watch for and signs of labor.  Ask about any pregnancy symptoms you are having and how you are dealing with them. Symptoms may include: ? Headaches. ? Nausea and vomiting. ? Vaginal discharge. ? Swelling. ? Fatigue. ? Constipation. ? Any discomfort, including back or pelvic pain. Make a list of questions to ask your health care provider at your routine visits. What tests might I have during prenatal care visits? You may have blood, urine, and imaging tests throughout your pregnancy, such as:  Urine tests to check for glucose, protein, or signs of infection.  Glucose tests to check for a form of diabetes that can develop during pregnancy (gestational diabetes mellitus). This is usually done around week 24 of pregnancy.  An ultrasound to check your baby's growth and development and to check for birth defects. This is usually done around week 20 of pregnancy.  A test to check for group B strep (GBS) infection. This is usually done around week 36 of pregnancy.  Genetic testing. This may include blood or imaging tests, such as an ultrasound. Some genetic tests are done during the first trimester and some are done during the second trimester. What else can I expect during prenatal care visits? Your health care provider may recommend getting certain vaccines during pregnancy. These may include:  A yearly flu shot (annual influenza vaccine). This is especially important if you will be pregnant during flu  season.  Tdap (tetanus, diphtheria, pertussis) vaccine. Getting this vaccine during pregnancy can protect your baby from whooping cough (pertussis) after birth. This vaccine may be recommended between weeks 27 and 36 of pregnancy. Later in your pregnancy, your health care provider may give you information about:  Childbirth and breastfeeding classes.  Choosing a health care provider for your baby.  Umbilical cord banking.  Breastfeeding.  Birth control after your baby is born.  The hospital labor and delivery unit and how to tour it.  Registering at the hospital before you go into labor. Where to find more information  Office on Women's Health: womenshealth.gov  American Pregnancy Association: americanpregnancy.org  March of Dimes: marchofdimes.org Summary  Prenatal care helps you and your baby stay as healthy as possible during pregnancy.  Your first prenatal care visit will most likely be the longest.  You will have visits and tests throughout your pregnancy to monitor your health and your baby's health.  Bring a list of questions to your visits to ask your health care provider.  Make sure to keep all follow-up and prenatal care visits with your health care provider. This information is not intended to replace advice given to you by your health care provider. Make sure you discuss any questions you have with your health care provider. Document Revised: 05/26/2018 Document Reviewed: 02/02/2017 Elsevier Patient Education  2020 [employer]  

## 2020-02-01 LAB — HGB FRACTIONATION CASCADE
Hgb A2: 3.1 % (ref 1.8–3.2)
Hgb A: 96.9 % (ref 96.4–98.8)
Hgb F: 0 % (ref 0.0–2.0)
Hgb S: 0 %

## 2020-02-01 LAB — RPR+RH+ABO+RUB AB+AB SCR+CB...
Antibody Screen: NEGATIVE
HIV Screen 4th Generation wRfx: NONREACTIVE
Hematocrit: 38.7 % (ref 34.0–46.6)
Hemoglobin: 13 g/dL (ref 11.1–15.9)
Hepatitis B Surface Ag: NEGATIVE
MCH: 30.9 pg (ref 26.6–33.0)
MCHC: 33.6 g/dL (ref 31.5–35.7)
MCV: 92 fL (ref 79–97)
Platelets: 387 10*3/uL (ref 150–450)
RBC: 4.21 x10E6/uL (ref 3.77–5.28)
RDW: 12.1 % (ref 11.7–15.4)
RPR Ser Ql: NONREACTIVE
Rh Factor: NEGATIVE
Rubella Antibodies, IGG: 2.29 index (ref >0.99)
Varicella zoster IgG: 480 index (ref >165)
WBC: 6.1 10*3/uL (ref 3.4–10.8)

## 2020-02-01 LAB — CYTOLOGY - PAP
Chlamydia: NEGATIVE
Comment: NEGATIVE
Comment: NEGATIVE
Comment: NEGATIVE
Comment: NORMAL
Diagnosis: NEGATIVE
High risk HPV: NEGATIVE
Neisseria Gonorrhea: NEGATIVE
Trichomonas: NEGATIVE

## 2020-02-01 LAB — URINE CULTURE: Organism ID, Bacteria: NO GROWTH

## 2020-02-05 LAB — MATERNIT21 PLUS CORE+SCA
Fetal Fraction: 3
Monosomy X (Turner Syndrome): NOT DETECTED
Result (T21): NEGATIVE
Trisomy 13 (Patau syndrome): NEGATIVE
Trisomy 18 (Edwards syndrome): NEGATIVE
Trisomy 21 (Down syndrome): NEGATIVE
XXX (Triple X Syndrome): NOT DETECTED
XXY (Klinefelter Syndrome): NOT DETECTED
XYY (Jacobs Syndrome): NOT DETECTED

## 2020-02-27 ENCOUNTER — Ambulatory Visit (INDEPENDENT_AMBULATORY_CARE_PROVIDER_SITE_OTHER): Payer: Medicaid Other | Admitting: Obstetrics and Gynecology

## 2020-02-27 ENCOUNTER — Other Ambulatory Visit: Payer: Self-pay

## 2020-02-27 ENCOUNTER — Encounter: Payer: Self-pay | Admitting: Obstetrics and Gynecology

## 2020-02-27 VITALS — BP 106/70 | Ht 62.0 in | Wt 150.4 lb

## 2020-02-27 DIAGNOSIS — O219 Vomiting of pregnancy, unspecified: Secondary | ICD-10-CM

## 2020-02-27 DIAGNOSIS — Z3401 Encounter for supervision of normal first pregnancy, first trimester: Secondary | ICD-10-CM

## 2020-02-27 DIAGNOSIS — Z3A13 13 weeks gestation of pregnancy: Secondary | ICD-10-CM

## 2020-02-27 MED ORDER — PROMETHAZINE HCL 25 MG PO TABS: 25.0000 mg | ORAL_TABLET | Freq: Four times a day (QID) | ORAL | 3 refills | Status: DC | PRN

## 2020-02-27 MED ORDER — ONDANSETRON 4 MG PO TBDP: 4.0000 mg | ORAL_TABLET | Freq: Four times a day (QID) | ORAL | 3 refills | Status: DC | PRN

## 2020-02-27 NOTE — Patient Instructions (Addendum)
Initial steps to help :   B6 (pyridoxine) 25 mg,  3-4 times a day- 200 mg a day total Unisom (doxylamine) 25 mg at bedtime **B6 and Unisom are available as a combination prescription medications called diclegis and bonjesta  B1 (thiamin)  50-100 mg 1-2 a day-  100 mg a day total  Continue prenatal vitamin with iron and thiamin. If it is not tolerated switch to 1 mg of folic acid.  Can add medication for gastric reflux if needed.  Subsequent steps to be added to B1, B6, and Unisom:  1. Antihistamine (one of the following medications) Dramamine      25-50 mg every 4-6 hours Benadryl      25-50 mg every 4-6 hours Meclizine      25 mg every 6 hours  2. Dopamine Antagonist (one of the following medications) Metoclopramide  (Reglan)  5-10 mg every 6-8 hours         PO Promethazine   (Phenergan)   12.5-25 mg every 4-6 hours      PO or rectal Prochlorperazine  (Compazine)  5-10 mg every 6-8 hours     25mg BID rectally   Subsequent steps if there has still not been improvement in symptoms:  3. Daily stool softner:  Colace 100 mg twice a day  4. Ondansetron  (Zofran)   4-8 mg every 6-8 hours       First Trimester of Pregnancy  The first trimester of pregnancy starts on the first day of your last menstrual period until the end of week 12. This is months 1 through 3 of pregnancy. A week after a sperm fertilizes an egg, the egg will implant into the wall of the uterus and begin to develop into a baby. By the end of 12 weeks, all the baby's organs will be formed and the baby will be 2-3 inches in size. Body changes during your first trimester Your body goes through many changes during pregnancy. The changes vary and generally return to normal after your baby is born. Physical changes  You may gain or lose weight.  Your breasts may begin to grow larger and become tender. The tissue that surrounds your nipples (areola) may become darker.  Dark spots or blotches (chloasma or mask of  pregnancy) may develop on your face.  You may have changes in your hair. These can include thickening or thinning of your hair or changes in texture. Health changes  You may feel nauseous, and you may vomit.  You may have heartburn.  You may develop headaches.  You may develop constipation.  Your gums may bleed and may be sensitive to brushing and flossing. Other changes  You may tire easily.  You may urinate more often.  Your menstrual periods will stop.  You may have a loss of appetite.  You may develop cravings for certain kinds of food.  You may have changes in your emotions from day to day.  You may have more vivid and strange dreams. Follow these instructions at home: Medicines  Follow your health care provider's instructions regarding medicine use. Specific medicines may be either safe or unsafe to take during pregnancy. Do not take any medicines unless told to by your health care provider.  Take a prenatal vitamin that contains at least 600 micrograms (mcg) of folic acid. Eating and drinking  Eat a healthy diet that includes fresh fruits and vegetables, whole grains, good sources of protein such as meat, eggs, or tofu, and low-fat dairy products.    raw meat and unpasteurized juice, milk, and cheese. These carry germs that can harm you and your baby.  If you feel nauseous or you vomit: ? Eat 4 or 5 small meals a day instead of 3 large meals. ? Try eating a few soda crackers. ? Drink liquids between meals instead of during meals.  You may need to take these actions to prevent or treat constipation: ? Drink enough fluid to keep your urine pale yellow. ? Eat foods that are high in fiber, such as beans, whole grains, and fresh fruits and vegetables. ? Limit foods that are high in fat and processed sugars, such as fried or sweet foods. Activity  Exercise only as directed by your health care provider. Most people can continue their usual exercise routine  during pregnancy. Try to exercise for 30 minutes at least 5 days a week.  Stop exercising if you develop pain or cramping in the lower abdomen or lower back.  Avoid exercising if it is very hot or humid or if you are at high altitude.  Avoid heavy lifting.  If you choose to, you may have sex unless your health care provider tells you not to. Relieving pain and discomfort  Wear a good support bra to relieve breast tenderness.  Rest with your legs elevated if you have leg cramps or low back pain.  If you develop bulging veins (varicose veins) in your legs: ? Wear support hose as told by your health care provider. ? Elevate your feet for 15 minutes, 3-4 times a day. ? Limit salt in your diet. Safety  Wear your seat belt at all times when driving or riding in a car.  Talk with your health care provider if someone is verbally or physically abusive to you.  Talk with your health care provider if you are feeling sad or have thoughts of hurting yourself. Lifestyle  Do not use hot tubs, steam rooms, or saunas.  Do not douche. Do not use tampons or scented sanitary pads.  Do not use herbal remedies, alcohol, illegal drugs, or medicines that are not approved by your health care provider. Chemicals in these products can harm your baby.  Do not use any products that contain nicotine or tobacco, such as cigarettes, e-cigarettes, and chewing tobacco. If you need help quitting, ask your health care provider.  Avoid cat litter boxes and soil used by cats. These carry germs that can cause birth defects in the baby and possibly loss of the unborn baby (fetus) by miscarriage or stillbirth. General instructions  During routine prenatal visits in the first trimester, your health care provider will do a physical exam, perform necessary tests, and ask you how things are going. Keep all follow-up visits. This is important.  Ask for help if you have counseling or nutritional needs during pregnancy.  Your health care provider can offer advice or refer you to specialists for help with various needs.  Schedule a dentist appointment. At home, brush your teeth with a soft toothbrush. Floss gently.  Write down your questions. Take them to your prenatal visits. Where to find more information  American Pregnancy Association: americanpregnancy.org  [employer]: https://www.todd-brady.net/  Office on [employer] [facility]: [employer] Contact a health care provider if you have:  Dizziness.  A fever.  Mild pelvic cramps, pelvic pressure, or nagging pain in the abdominal area.  Nausea, vomiting, or diarrhea that lasts for 24 hours or longer.  A bad-smelling vaginal discharge.  Pain when you urinate.  urinate.  Known exposure to a contagious illness, such as chickenpox, measles, Zika virus, HIV, or hepatitis. Get help right away if you have:  Spotting or bleeding from your vagina.  Severe abdominal cramping or pain.  Shortness of breath or chest pain.  Any kind of trauma, such as from a fall or a car crash.  New or increased pain, swelling, or redness in an arm or leg. Summary  The first trimester of pregnancy starts on the first day of your last menstrual period until the end of week 12 (months 1 through 3).  Eating 4 or 5 small meals a day rather than 3 large meals may help to relieve nausea and vomiting.  Do not use any products that contain nicotine or tobacco, such as cigarettes, e-cigarettes, and chewing tobacco. If you need help quitting, ask your health care provider.  Keep all follow-up visits. This is important. This information is not intended to replace advice given to you by your health care provider. Make sure you discuss any questions you have with your health care provider. Document Revised: 07/13/2019 Document Reviewed: 05/19/2019 Elsevier Patient Education  2021 Elsevier Inc.   Hyperemesis  Gravidarum Hyperemesis gravidarum is a severe form of nausea and vomiting that happens during pregnancy. Hyperemesis is worse than morning sickness. It may cause you to have nausea or vomiting all day for many days. It may keep you from eating and drinking enough food and liquids, which can lead to dehydration, malnutrition, and weight loss. Hyperemesis usually occurs during the first half (the first 20 weeks) of pregnancy. It often goes away once a woman is in her second half of pregnancy. However, sometimes hyperemesis continues through an entire pregnancy. What are the causes? The cause of this condition is not known. It may be associated with:  Changes in hormones in the body during pregnancy.  Changes in the gastrointestinal system.  Genetic or inherited conditions. What are the signs or symptoms? Symptoms of this condition include:  Severe nausea and vomiting that does not go away.  Problems keeping food down.  Weight loss.  Loss of body fluid (dehydration).  Loss of appetite. You may have no desire to eat or you may not like the food you have previously enjoyed. How is this diagnosed? This condition may be diagnosed based on your medical history, your symptoms, and a physical exam. You may also have other tests, including:  Blood tests.  Urine tests.  Blood pressure tests.  Ultrasound to look for problems with the placenta or to check if you are pregnant with more than one baby. How is this treated? This condition is managed by controlling symptoms. This may include:  Following an eating plan. This can help to lessen nausea and vomiting.  Treatments that do not use medicine. These include acupressure bracelets, hypnosis, and eating or drinking foods or fluids that contain ginger, ginger ale, or ginger tea.  Taking prescription medicine or over-the-counter medicine as told by your health care provider.  Continuing to take prenatal vitamins. You may need to change what  kind you take and when you take them. Follow your health care provider's instructions about prenatal vitamins. An eating plan and medicines are often used together to help control symptoms. If medicines do not help relieve nausea and vomiting, you may need to receive fluids through an IV at the hospital. Follow these instructions at home: To help relieve your symptoms, listen to your body. Everyone is different and has different preferences. Find what works   you. Here are some things you can try to help relieve your symptoms: Meals and snacks  Eat 5-6 small meals daily instead of 3 large meals. Eating small meals and snacks can help you avoid an empty stomach.  Before getting out of bed, eat a couple of crackers to avoid moving around on an empty stomach.  Eat a protein-rich snack before bed. Examples include cheese and crackers, or a peanut butter sandwich made with 1 slice of whole-wheat bread and 1 tsp (5 g) of peanut butter.  Eat and drink slowly.  Try eating starchy foods as these are usually tolerated well. Examples include cereal, toast, bread, potatoes, pasta, rice, and pretzels.  Eat at least one serving of protein with your meals and snacks. Protein options include lean meats, poultry, seafood, beans, nuts, nut butters, eggs, cheese, and yogurt.  Eat or suck on things that have ginger in them. It may help to relieve nausea. Add  tsp (0.44 g) ground ginger to hot tea, or choose ginger tea.   Fluids It is important to stay hydrated. Try to:  Drink small amounts of fluids often.  Drink fluids 30 minutes before or after a meal to help lessen the feeling of a full stomach.  Drink 100% fruit juice or an electrolyte drink. An electrolyte drink contains sodium, potassium, and chloride.  Drink fluids that are cold, clear, and carbonated or sour. These include lemonade, ginger ale, lemon-lime soda, ice water, and sparkling water. Things to avoid Avoid the following:  Eating  foods that trigger your symptoms. These may include spicy foods, coffee, high-fat foods, very sweet foods, and acidic foods.  Drinking more than 1 cup of fluid at a time.  Skipping meals. Nausea can be more intense on an empty stomach. If you cannot tolerate food, do not force it. Try sucking on ice chips or other frozen items and make up for missed calories later.  Lying down within 2 hours after eating.  Being exposed to environmental triggers. These may include food smells, smoky rooms, closed spaces, rooms with strong smells, warm or humid places, overly loud and noisy rooms, and rooms with motion or flickering lights. Try eating meals in a well-ventilated area that is free of strong smells.  Making quick and sudden changes in your movement.  Taking iron pills and multivitamins that contain iron. If you take prescription iron pills, do not stop taking them unless your health care provider approves.  Preparing food. The smell of food can spoil your appetite or trigger nausea. General instructions  Brush your teeth or use a mouth rinse after meals.  Take over-the-counter and prescription medicines only as told by your health care provider.  Follow instructions from your health care provider about eating or drinking restrictions.  Talk with your health care provider about starting a supplement of vitamin B6.  Continue to take your prenatal vitamins as told by your health care provider. If you are having trouble taking your prenatal vitamins, talk with your health care provider about other options.  Keep all follow-up visits. This is important. Follow-up visits include prenatal visits. Contact a health care provider if:  You have pain in your abdomen.  You have a severe headache.  You have vision problems.  You are losing weight.  You feel weak or dizzy.  You cannot eat or drink without vomiting, especially if this goes on for a full day. Get help right away if:  You cannot  drink fluids without vomiting.  You vomit  blood.  You have constant nausea and vomiting.  You are very weak.  You faint.  You have a fever and your symptoms suddenly get worse. Summary  Hyperemesis gravidarum is a severe form of nausea and vomiting that happens during pregnancy.  Making some changes to your eating habits may help relieve nausea and vomiting.  This condition may be managed with lifestyle changes and medicines as prescribed by your health care provider.  If medicines do not help relieve nausea and vomiting, you may need to receive fluids through an IV at the hospital. This information is not intended to replace advice given to you by your health care provider. Make sure you discuss any questions you have with your health care provider. Document Revised: 08/29/2019 Document Reviewed: 08/29/2019 Elsevier Patient Education  2021 [employer].

## 2020-02-27 NOTE — Progress Notes (Signed)
    Routine Prenatal Care Visit  Subjective  Stacy Palmer is a 30 y.o. G1P0000 at [redacted]w[redacted]d being seen today for ongoing prenatal care.  She is currently monitored for the following issues for this low-risk pregnancy and has Encounter for supervision of normal first pregnancy in first trimester on their problem list.  ----------------------------------------------------------------------------------- Patient reports no complaints.   Contractions: Not present. Vag. Bleeding: None.  Movement: Absent. Denies leaking of fluid.  ----------------------------------------------------------------------------------- The following portions of the patient's history were reviewed and updated as appropriate: allergies, current medications, past family history, past medical history, past social history, past surgical history and problem list. Problem list updated.   Objective  Blood pressure 106/70, height 5\' 2"  (1.575 m), weight 150 lb 6.4 oz (68.2 kg), last menstrual period 11/22/2019. Pregravid weight Pregravid weight not on file Total Weight Gain Not found. Urinalysis:      Fetal Status: Fetal Heart Rate (bpm): 170   Movement: Absent     General:  Alert, oriented and cooperative. Patient is in no acute distress.  Skin: Skin is warm and dry. No rash noted.   Cardiovascular: Normal heart rate noted  Respiratory: Normal respiratory effort, no problems with respiration noted  Abdomen: Soft, gravid, appropriate for gestational age. Pain/Pressure: Absent     Pelvic:  Cervical exam deferred        Extremities: Normal range of motion.  Edema: None  Mental Status: Normal mood and affect. Normal behavior. Normal judgment and thought content.     Assessment   30 y.o. G1P0000 at [redacted]w[redacted]d by  08/28/2020, by Last Menstrual Period presenting for routine prenatal visit  Plan   pregnancy 1 Problems (from 01/30/20 to present)    Problem Noted Resolved   Encounter for supervision of normal first pregnancy in first  trimester 01/30/2020 by 02/01/2020, CNM No   Overview Addendum 02/27/2020 11:37 AM by 04/26/2020, MD     Nursing Staff Provider  Office Location  [facility] Dating   LMP = 9 wk [name]  Language  English Anatomy US    Flu Vaccine  declined Genetic Screen  NIPS:   normal   TDaP vaccine    Hgb A1C or  GTT Third trimester :   Rhogam   [ ]  28 weeks   LAB RESULTS   Feeding Plan  formula Blood Type O/Negative/-- (12/13 1059)   Contraception  Antibody Negative (12/13 1059)  Circumcision  Rubella 2.29 (12/13 1059)  Pediatrician   RPR Non Reactive (12/13 1059)   Support Person  [name] HBsAg Negative (12/13 1059)   Prenatal Classes  HIV Non Reactive (12/13 1059)    Varicella  immune  BTL Consent  GBS  (For PCN allergy, check sensitivities)        VBAC Consent  Pap  2021 NIL    Hgb Electro   normal  COVID unvaccianted CF      SMA               Previous Version     Discussed plan for hyperemesis- rx sent Given gender envelope Encouraged Covid vaccination Will consider flu vaccination  Gestational age appropriate obstetric precautions including but not limited to vaginal bleeding, contractions, leaking of fluid and fetal movement were reviewed in detail with the patient.    Return in about 4 weeks (around 03/26/2020) for ROB in person.  2022 MD [facility], [facility] 02/27/2020, 11:38 AM

## 2020-03-06 ENCOUNTER — Telehealth: Payer: Self-pay

## 2020-03-06 NOTE — Telephone Encounter (Signed)
Patient reports she has had some intermittent spotting. Denies pain/cramping. [phone]

## 2020-03-06 NOTE — Telephone Encounter (Signed)
Spoke w/patient. She advised she had spotting when she first found out she was pregnant for a few weeks. It stopped and has now started back. Denies intercourse within 72 hours prior of spotting. First was just yellow d/c then progressed to brown and now a light pink, but not much at all. Advised to monitor and report/call for apt to eval if bleeding increases/is bright red or she experiences any cramping/pain.

## 2020-03-12 ENCOUNTER — Other Ambulatory Visit: Payer: Self-pay

## 2020-03-12 ENCOUNTER — Encounter: Payer: Self-pay | Admitting: Advanced Practice Midwife

## 2020-03-12 ENCOUNTER — Ambulatory Visit (INDEPENDENT_AMBULATORY_CARE_PROVIDER_SITE_OTHER): Payer: Medicaid Other | Admitting: Advanced Practice Midwife

## 2020-03-12 VITALS — BP 108/70 | Ht 62.0 in | Wt 150.6 lb

## 2020-03-12 DIAGNOSIS — O209 Hemorrhage in early pregnancy, unspecified: Secondary | ICD-10-CM

## 2020-03-12 DIAGNOSIS — Z3A15 15 weeks gestation of pregnancy: Secondary | ICD-10-CM

## 2020-03-12 DIAGNOSIS — O039 Complete or unspecified spontaneous abortion without complication: Secondary | ICD-10-CM

## 2020-03-12 NOTE — Patient Instructions (Signed)
Miscarriage A miscarriage is the loss of pregnancy before the 20th week. Most miscarriages happen during the first 3 months of pregnancy. Sometimes, a miscarriage can happen before a woman knows that she is pregnant. Having a miscarriage can be an emotional experience. If you have had a miscarriage, talk with your health care provider about any questions you may have about the loss of your baby, the grieving process, and your plans for future pregnancy. What are the causes? Many times, the cause of a miscarriage is not known. What increases the risk? The following factors may make a pregnant woman more likely to have a miscarriage: Certain medical conditions  Conditions that affect the hormone balance in the body, such as thyroid disease or polycystic ovary syndrome.  Diabetes.  Autoimmune disorders.  Infections.  Bleeding disorders.  Obesity. Lifestyle factors  Using products with tobacco or nicotine in them or being exposed to tobacco smoke.  Having alcohol.  Having large amounts of caffeine.  Recreational drug use. Problems with reproductive organs or structures  Cervical insufficiency. This is when the lowest part of the uterus (cervix) opens and thins before pregnancy is at term.  Having a condition called Asherman syndrome. This syndrome causes scarring in the uterus or causes the uterus to be abnormal in structure.  Fibrous growths, called fibroids, in the uterus.  Congenital abnormalities. These problems are present at birth.  Infection of the cervix or uterus. Personal or medical history  Injury (trauma).  Having had a miscarriage before.  Being younger than age 18 or older than age 35.  Exposure to harmful substances in the environment. This may include radiation or heavy metals, such as lead.  Use of certain medicines. What are the signs or symptoms? Symptoms of this condition include:  Vaginal bleeding or spotting, with or without cramps or  pain.  Pain or cramping in the abdomen or lower back.  Fluid or tissue coming out of the vagina. How is this diagnosed? This condition may be diagnosed based on:  A physical exam.  Ultrasound.  Lab tests, such as blood tests, urine tests, or swabs for infection. How is this treated? Treatment for a miscarriage is sometimes not needed if all the pregnancy tissue that was in the uterus comes out on its own, and there are no other problems such as infection or heavy bleeding. In other cases, this condition may be treated with:  Dilation and curettage (D&C). In this procedure, the cervix is stretched open and any remaining pregnancy tissue is removed from the lining of the uterus (endometrium).  Medicines. These may include: ? Antibiotic medicine, to treat infection. ? Medicine to help any remaining pregnancy tissue come out of the body. ? Medicine to reduce (contract) the size of the uterus. These medicines may be given if there is a lot of bleeding. If you have Rh-negative blood, you may be given an injection of a medicine called Rho(D) immune globulin. This medicine helps prevent problems with future pregnancies. Follow these instructions at home: Medicines  Take over-the-counter and prescription medicines only as told by your health care provider.  If you were prescribed antibiotic medicine, take it as told by your health care provider. Do not stop taking the antibiotic even if you start to feel better. Activity  Rest as told by your health care provider. Ask your health care provider what activities are safe for you.  Have someone help with home and family responsibilities during this time. General instructions  Monitor how much tissue   or blood clot material comes out of the vagina.  Do not have sex, douche, or put anything, such as tampons, in your vagina until your health care provider says it is okay.  To help you and your partner with the grieving process, talk with your  health care provider or get counseling.  When you are ready, meet with your health care provider to discuss any important steps you should take for your health. Also, discuss steps you should take to have a healthy pregnancy in the future.  Keep all follow-up visits. This is important.   Where to find more information  The American College of Obstetricians and Gynecologists: acog.org  [place] Department of Health and Human Services Office of Women's Health: hrsa.gov/office-womens-health Contact a health care provider if:  You have a fever or chills.  There is bad-smelling fluid coming from the vagina.  You have more bleeding instead of less.  Tissue or blood clots come out of your vagina. Get help right away if:  You have severe cramps or pain in your back or abdomen.  Heavy bleeding soaks through 2 large sanitary pads an hour for more than 2 hours.  You become light-headed or weak.  You faint.  You feel sad, and your sadness takes over your thoughts.  You think about hurting yourself. If you ever feel like you may hurt yourself or others, or have thoughts about taking your own life, get help right away. Go to your nearest emergency department or:  Call your local emergency services (911 in the [place]  Call a suicide crisis helpline, such as the National Suicide Prevention Lifeline at 1-800-273-8255. This is open 24 hours a day in the [place]  Text the Crisis Text Line at 741741 (in the [place] Summary  Most miscarriages happen in the first 3 months of pregnancy. Sometimes miscarriage happens before a woman knows that she is pregnant.  Follow instructions from your health care provider about medicines and activity.  To help you and your partner with grieving, talk with your health care provider or get counseling.  Keep all follow-up visits. This information is not intended to replace advice given to you by your health care provider. Make sure you discuss any questions you  have with your health care provider. Document Revised: 08/05/2019 Document Reviewed: 08/05/2019 Elsevier Patient Education  2021 Elsevier Inc.  

## 2020-03-12 NOTE — Progress Notes (Signed)
Routine Prenatal Care Visit  Subjective  Stacy Palmer is a 30 y.o. G1P0000 at [redacted]w[redacted]d being seen today for ongoing prenatal care.  She is currently monitored for the following issues for this low-risk pregnancy and has Encounter for supervision of normal first pregnancy in first trimester on their problem list.  ----------------------------------------------------------------------------------- Patient reports bright red bleeding for the past day with wiping and a small amount on pad. She started having brown and then pink spotting beginning 6 days ago. She reports mild cramping.    . Vag. Bleeding: Small.  Movement: Absent. Leaking Fluid denies.  ----------------------------------------------------------------------------------- The following portions of the patient's history were reviewed and updated as appropriate: allergies, current medications, past family history, past medical history, past social history, past surgical history and problem list. Problem list updated.  Objective  Blood pressure 108/70, height 5\' 2"  (1.575 m), weight 150 lb 9.6 oz (68.3 kg), last menstrual period 11/22/2019. Pregravid weight Pregravid weight not on file Total Weight Gain Not found. Urinalysis: Urine Protein    Urine Glucose    Fetal Status: Fetal Heart Rate (bpm): absent   Movement: Absent      Bedside Ultrasound: absent fetal movement, absent fetal heart rate, CRL [redacted]w[redacted]d  General:  Alert, oriented and cooperative. Patient is in no acute distress.  Skin: Skin is warm and dry. No rash noted.   Cardiovascular: Normal heart rate noted  Respiratory: Normal respiratory effort, no problems with respiration noted  Abdomen: Soft, gravid, appropriate for gestational age.       Pelvic:  Cervical exam deferred        Extremities: Normal range of motion.     Mental Status: Normal mood and affect. Normal behavior. Normal judgment and thought content.   Assessment   29 y.o. G1P0000 at [redacted]w[redacted]d by  08/28/2020, by Last  Menstrual Period presenting for work-in prenatal visit  Plan   pregnancy 1 Problems (from 01/30/20 to present)    Problem Noted Resolved   Encounter for supervision of normal first pregnancy in first trimester 01/30/2020 by 02/01/2020, CNM No   Overview Addendum 02/27/2020 11:37 AM by 04/26/2020, MD     Nursing Staff Provider  Office Location  [facility] Dating   LMP = 9 wk [name]  Language  English Anatomy US    Flu Vaccine  declined Genetic Screen  NIPS:   normal   TDaP vaccine    Hgb A1C or  GTT Third trimester :   Rhogam   [ ]  28 weeks   LAB RESULTS   Feeding Plan  formula Blood Type O/Negative/-- (12/13 1059)   Contraception  Antibody Negative (12/13 1059)  Circumcision  Rubella 2.29 (12/13 1059)  Pediatrician   RPR Non Reactive (12/13 1059)   Support Person  [name] HBsAg Negative (12/13 1059)   Prenatal Classes  HIV Non Reactive (12/13 1059)    Varicella  immune  BTL Consent  GBS  (For PCN allergy, check sensitivities)        VBAC Consent  Pap  2021 NIL    Hgb Electro   normal  COVID unvaccianted CF      SMA               Previous Version    Discussed findings of bedside ultrasound today- no fetal movement or heartbeat seen by myself or Dr 02[birth year]09[birth year]1988. Baby is measuring [redacted]w[redacted]d CRL per Dr Bonney Aid. He discussed options regarding management of miscarriage including; repeat ultrasound if patient desires to be certain of findings,  await spontaneous passage of POC, medication management or D&C. Patient is advised to take some time to consider options. Condolences given.   Called patient after she left to advise her of the need for Rhogam injection. She will come in tomorrow morning for the injection.     Please refer to After Visit Summary for other counseling recommendations.   Return if symptoms worsen or fail to improve.  Tresea Mall, CNM 03/12/2020 11:29 AM

## 2020-03-13 ENCOUNTER — Other Ambulatory Visit: Payer: Self-pay | Admitting: Advanced Practice Midwife

## 2020-03-13 ENCOUNTER — Ambulatory Visit (INDEPENDENT_AMBULATORY_CARE_PROVIDER_SITE_OTHER): Payer: Medicaid Other

## 2020-03-13 DIAGNOSIS — O039 Complete or unspecified spontaneous abortion without complication: Secondary | ICD-10-CM

## 2020-03-13 DIAGNOSIS — Z3401 Encounter for supervision of normal first pregnancy, first trimester: Secondary | ICD-10-CM

## 2020-03-13 MED ORDER — RHO D IMMUNE GLOBULIN 1500 UNIT/2ML IJ SOSY
300.0000 ug | PREFILLED_SYRINGE | Freq: Once | INTRAMUSCULAR | Status: AC
  Administered 2020-03-13: 300 ug via INTRAMUSCULAR

## 2020-03-14 ENCOUNTER — Other Ambulatory Visit: Payer: Self-pay

## 2020-03-14 ENCOUNTER — Emergency Department: Payer: Medicaid Other

## 2020-03-14 ENCOUNTER — Emergency Department
Admission: EM | Admit: 2020-03-14 | Discharge: 2020-03-14 | Disposition: A | Discharge status: Home / Self Care | Payer: Medicaid Other | Attending: Emergency Medicine | Admitting: Emergency Medicine

## 2020-03-14 DIAGNOSIS — Z3A00 Weeks of gestation of pregnancy not specified: Secondary | ICD-10-CM | POA: Insufficient documentation

## 2020-03-14 DIAGNOSIS — O039 Complete or unspecified spontaneous abortion without complication: Secondary | ICD-10-CM

## 2020-03-14 LAB — BASIC METABOLIC PANEL
Anion gap: 12 (ref 5–15)
BUN: 10 mg/dL (ref 6–20)
CO2: 24 mmol/L (ref 22–32)
Calcium: 9.5 mg/dL (ref 8.9–10.3)
Chloride: 104 mmol/L (ref 98–111)
Creatinine, Ser: 0.63 mg/dL (ref 0.44–1.00)
GFR, Estimated: >60 mL/min (ref >60)
Glucose, Bld: 142 mg/dL — ABNORMAL HIGH (ref 70–99)
Potassium: 3.9 mmol/L (ref 3.5–5.1)
Sodium: 140 mmol/L (ref 135–145)

## 2020-03-14 LAB — WET PREP, GENITAL
Clue Cells Wet Prep HPF POC: NONE SEEN
Sperm: NONE SEEN
Trich, Wet Prep: NONE SEEN
Yeast Wet Prep HPF POC: NONE SEEN

## 2020-03-14 LAB — CBC
HCT: 36.1 % (ref 36.0–46.0)
Hemoglobin: 12.3 g/dL (ref 12.0–15.0)
MCH: 29.8 pg (ref 26.0–34.0)
MCHC: 34.1 g/dL (ref 30.0–36.0)
MCV: 87.4 fL (ref 80.0–100.0)
Platelets: 436 10*3/uL — ABNORMAL HIGH (ref 150–400)
RBC: 4.13 MIL/uL (ref 3.87–5.11)
RDW: 11.9 % (ref 11.5–15.5)
WBC: 16.7 10*3/uL — ABNORMAL HIGH (ref 4.0–10.5)
nRBC: 0 % (ref 0.0–0.2)

## 2020-03-14 LAB — CHLAMYDIA/NGC RT PCR (ARMC ONLY)
Chlamydia Tr: NOT DETECTED
N gonorrhoeae: NOT DETECTED

## 2020-03-14 LAB — ABO/RH: ABO/RH(D): O NEG

## 2020-03-14 LAB — HCG, QUANTITATIVE, PREGNANCY: hCG, Beta Chain, Quant, S: 5293 m[IU]/mL — ABNORMAL HIGH (ref <5)

## 2020-03-14 MED ORDER — IBUPROFEN 800 MG PO TABS
800.0000 mg | ORAL_TABLET | Freq: Once | ORAL | Status: AC
  Administered 2020-03-14: 800 mg via ORAL
  Filled 2020-03-14: qty 1

## 2020-03-14 MED ORDER — RHO D IMMUNE GLOBULIN 1500 UNIT/2ML IJ SOSY
300.0000 ug | PREFILLED_SYRINGE | Freq: Once | INTRAMUSCULAR | Status: DC
  Filled 2020-03-14: qty 2

## 2020-03-14 MED ORDER — OXYCODONE-ACETAMINOPHEN 5-325 MG PO TABS
1.0000 | ORAL_TABLET | Freq: Once | ORAL | Status: AC
  Administered 2020-03-14: 1 via ORAL
  Filled 2020-03-14: qty 1

## 2020-03-14 NOTE — Discharge Instructions (Addendum)
Please call [facility] and let them know what happened.  Please get an appointment to follow up with them in the next 2 to 3 days or so.  Please asked him to follow-up on the vaginal specimens that I took today.  Return here or see them if you be having worse bleeding or severe cramping.  If you are having some mild cramping take Motrin 803 times a day for one or 2 days.

## 2020-03-14 NOTE — ED Notes (Signed)
Pt taken to US at this time

## 2020-03-14 NOTE — ED Notes (Signed)
Having vaginal bleeding.  Patient assessed.  Skin warm and dry.  NAD at this time.  P:  84 by radial pulse.

## 2020-03-14 NOTE — ED Provider Notes (Signed)
[facility] Emergency Department Provider Note   ____________________________________________   Event Date/Time   First MD Initiated Contact with Patient 03/14/20 [phone]     (approximate)  I have reviewed the triage vital signs and the nursing notes.   HISTORY  Chief Complaint Miscarriage    HPI Stacy Palmer is a 30 y.o. female who had cramping and bleeding yesterday and was seen at [facility].  She got RhoGam then.  Chart says she got 300 mcg.  Patient then had cramping last night and did not call until this morning after she had passed the fetus.  Here ultrasound was done which shows no retained products.  Patient reports the bleeding has stopped and cramping has markedly decreased.         Past Medical History:  Diagnosis Date  . Menorrhagia 2013    Patient Active Problem List   Diagnosis Date Noted  . Encounter for supervision of normal first pregnancy in first trimester 01/30/2020    Past Surgical History:  Procedure Laterality Date  . denies      Prior to Admission medications   Medication Sig Start Date End Date Taking? Authorizing Provider  ondansetron (ZOFRAN ODT) 4 MG disintegrating tablet Take 1 tablet (4 mg total) by mouth every 6 (six) hours as needed for nausea. 02/27/20   Schuman, Jaquelyn Bitter, MD  Prenatal Vit-Fe Fumarate-FA (MULTIVITAMIN-PRENATAL) 27-0.8 MG TABS tablet Take 1 tablet by mouth daily at 12 noon. 12/30/19 04/08/20  Federico Flake, MD  promethazine (PHENERGAN) 25 MG tablet Take 1 tablet (25 mg total) by mouth every 6 (six) hours as needed for nausea or vomiting. 02/27/20   [name], MD    Allergies Patient has no known allergies.  Family History  Problem Relation Age of Onset  . Hypertension Mother   . Migraines Mother   . Diabetes Mother   . Hypertension Maternal Grandmother   . Hypertension Paternal Grandmother     Social History Social History   Tobacco Use  . Smoking status: Never  Smoker  . Smokeless tobacco: Never Used  Vaping Use  . Vaping Use: Never used  Substance Use Topics  . Alcohol use: Not Currently    Comment: last use- 12/24/19 tequila  . Drug use: Not Currently    Types: Marijuana    Comment: hx marijuana - stopped 02/2019    Review of Systems  Constitutional: No fever/chills Eyes: No visual changes. ENT: No sore throat. Cardiovascular: Denies chest pain. Respiratory: Denies shortness of breath. Gastrointestinal: No abdominal pain.  No nausea, no vomiting.  No diarrhea.  No constipation. Genitourinary: Negative for dysuria. Musculoskeletal: Negative for back pain. Skin: Negative for rash. Neurological: Negative for headaches, focal weakness   ____________________________________________   PHYSICAL EXAM:  VITAL SIGNS: ED Triage Vitals  Enc Vitals Group     BP 03/14/20 0652 108/68     Pulse Rate 03/14/20 0652 86     Resp 03/14/20 0652 18     Temp 03/14/20 0652 98.1 F (36.7 C)     Temp Source 03/14/20 0652 Oral     SpO2 03/14/20 0652 100 %     Weight 03/14/20 0653 149 lb 14.6 oz (68 kg)     Height 03/14/20 0653 5\' 2"  (1.575 m)     Head Circumference --      Peak Flow --      Pain Score 03/14/20 0653 0     Pain Loc --      Pain Edu? --  Excl. in GC? --     Constitutional: Alert and oriented. Well appearing and in no acute distress. Eyes: Conjunctivae are normal.  Head: Atraumatic. Nose: No congestion/rhinnorhea. Mouth/Throat: Mucous membranes are moist.  Oropharynx non-erythematous. Neck: No stridor.   Cardiovascular: Normal rate, regular rhythm. Grossly normal heart sounds.  Good peripheral circulation. Respiratory: Normal respiratory effort.  No retractions. Lungs CTAB. Gastrointestinal: Soft and nontender. No distention. No abdominal bruits.. Genitourinary: There is some smears of blood on the perineum.  There is a pool of dark blood in the vagina with some clots.  This was removed and there was a very small amount of  bleeding from the os which was easy to dry up with a fox sponge.  Cervix was fingertip.  There is no cervical motion tenderness.  Uterus was enlarged but smaller than I would expect for dates.  There were no masses or tenderness palpated. Musculoskeletal: No lower extremity tenderness nor edema.  No joint effusions. Neurologic:  Normal speech and language. No gross focal neurologic deficits are appreciated.. Skin:  Skin is warm, dry and intact. No rash noted.   ____________________________________________   LABS (all labs ordered are listed, but only abnormal results are displayed)  Labs Reviewed  WET PREP, GENITAL - Abnormal; Notable for the following components:      Result Value   WBC, Wet Prep HPF POC FEW (*)    All other components within normal limits  CBC - Abnormal; Notable for the following components:   WBC 16.7 (*)    Platelets 436 (*)    All other components within normal limits  BASIC METABOLIC PANEL - Abnormal; Notable for the following components:   Glucose, Bld 142 (*)    All other components within normal limits  HCG, QUANTITATIVE, PREGNANCY - Abnormal; Notable for the following components:   hCG, Beta Chain, Quant, S 5,293 (*)    All other components within normal limits  CHLAMYDIA/NGC RT PCR ([facility] ONLY)  ABO/RH   ____________________________________________  EKG   ____________________________________________  RADIOLOGY Jill Poling, personally viewed and evaluated these images (plain radiographs) as part of my medical decision making, as well as reviewing the written report by the radiologist.  ED MD interpretation: Ultrasound does not show any retained products.  There is just some thickening of the endometrium  Official radiology report(s): US OB Comp Less 14 Wks  Result Date: 03/14/2020 CLINICAL DATA:  Miscarriage. As per RN fetus found in patient's underwear in waiting room and placed in container of formalin. EXAM: OBSTETRIC <14 WK  ULTRASOUND TECHNIQUE: Transabdominal ultrasound was performed for evaluation of the gestation as well as the maternal uterus and adnexal regions. Patient refused transvaginal exam. COMPARISON:  01/30/2020. FINDINGS: Intrauterine gestational sac: None visualized Yolk sac: None visualized Embryo:  None visualized Cardiac Activity: None visualized Subchorionic hemorrhage:  None visualized. Maternal uterus/adnexae: Uterus 11.6 x 5.3 x 5.9 cm with a volume of 189.1 mL. No focal uterine abnormality identified. Endometrial thickness is 36.4 mm. Right ovary measures 2.8 x 1.1 x 2.0 cm with a volume of 3.3 mL. Left ovary measures 3.4 x 1.7 x 2.0 cm with a volume of 6.02 mL. No focal ovarian abnormality identified. No free pelvic fluid. IMPRESSION: 1.  No intrauterine pregnancy noted.  See history above. 2. Endometrial thickening consistent with recent miscarriage. Follow-up exam can be obtained to demonstrate return to normalcy. No acute or focal abnormality identified. No free pelvic fluid. Electronically Signed   By: Maisie Fus  Register  On: 03/14/2020 09:06    ____________________________________________   PROCEDURES  Procedure(s) performed (including Critical Care):  Procedures   ____________________________________________   INITIAL IMPRESSION / ASSESSMENT AND PLAN / ED COURSE  Discussed briefly with Dr. Tiburcio Pea.  We will have the patient follow-up at [facility] in 2 to 3 days.  She had already gotten 300 mcg of RhoGam yesterday.  This should be enough Dr. Tiburcio Pea thought.  Patient had a completed miscarriage.              ____________________________________________   FINAL CLINICAL IMPRESSION(S) / ED DIAGNOSES  Final diagnoses:  Miscarriage     ED Discharge Orders    None      *Please note:  Terecia Plaut was evaluated in Emergency Department on 03/14/2020 for the symptoms described in the history of present illness. She was evaluated in the context of the global COVID-19 pandemic,  which necessitated consideration that the patient might be at risk for infection with the SARS-CoV-2 virus that causes COVID-19. Institutional protocols and algorithms that pertain to the evaluation of patients at risk for COVID-19 are in a state of rapid change based on information released by regulatory bodies including the CDC and federal and state organizations. These policies and algorithms were followed during the patient's care in the ED.  Some ED evaluations and interventions may be delayed as a result of limited staffing during and the pandemic.*   Note:  This document was prepared using Dragon voice recognition software and may include unintentional dictation errors.    Arnaldo Natal, MD 03/14/20 [phone]

## 2020-03-14 NOTE — ED Triage Notes (Signed)
Reports spotting since Sunday, bright red bleeding and contractions that started last night. EMS reports fetus is expelled at this time. Pt approx [redacted] weeks pregnant. Pt alert and oriented X4, cooperative, RR even and unlabored, color WNL. VSS with EMS

## 2020-03-14 NOTE — ED Notes (Signed)
Provided discharge instructions. Patient verbalized understanding. Alert and ambulatory and in no acute distress.

## 2020-03-14 NOTE — ED Notes (Signed)
Pt removes pants, noted small formed fetus with cord attached to fetus in panties; fetus retrieved and placed in jar of formalin with pt label; no vaginal bleeding noted at this time

## 2020-03-15 ENCOUNTER — Telehealth: Payer: Self-pay

## 2020-03-15 NOTE — Telephone Encounter (Addendum)
Pt came in and saw Erskine Squibb 1/24 for bleeding. Jane sent me a surgery request for suction D&C as there was no FHR on u/s (confirmed by AMS).   03/13/20 - I spoke to pt in clinic (she came in for Rhogam shot) and adv that I could schedule her for the mentioned procedure on 1/27, Thursday. She adv that she would rather wait until next week, 2/1.  03/14/20 - I was creating the OR case Wednesday morning, 1/26. Epic alerted me that pt was currently in ER for miscarriage and it wasn't allowing me to schedule the case. She was discharged mid-morning and was able to finish the case [facility] as MD). I then contacted Erskine Squibb via secure chat to inform that pt had been in ED for miscarriage and according to notes did not retain products of conception. I asked if pt still needed to have D&C. She adv "It appears that it was a complete miscarriage and so no D&C is needed unless she starts bleeding heavily which could indicate retained products. I'm guessing she was discharged with precautions."   03/15/20 - Pt called in to follow up as ED instructed following her miscarriage. I secure chatted Harris to confirm canceling the procedure was appropriate (since I had scheduled it with him but had yet to notify him because of all that had occurred). He adv that according to ED report, there is not need for D&C and to cancel it and future OB appts. He adv to schedule pt w Bonney Aid next week for ED follow up.  I adv pt that the provider confirmed that there was no longer a need for D&C and that I would schedule her for a follow up with Dr Bonney Aid. She questioned which provider was AMS and then, before I could answer, she adv that she has a problem and doesn't feel like she received the care she should have. She wanted to know "were my numbers bad at my other appointments" and no one mentioned anything? She said that she wasn't told how this could happen (I.e. miscarriage). "I had my baby in the toilet." The ED was asking the blood type of the  baby's father? She didn't feel like she was prepared for what happened. "This is my first pregnancy." Overall she was very upset with the whole situation. She was not irate or ulgy acting to me at all. It was more that she was frustrated and felt uninformed. (Also all emotions that occur after losing a pregnancy.)  I adv that I would discuss this with my supervisor and that someone would be in touch with her.   After her call I spoke to Conroy. She looked at the pt chart and saw where she was provided information as well as an info sheet regarding miscarriage in her MyChart. Harriett Sine suggested that I let Erskine Squibb know of the call so that she can follow back up with the patient.  I will wait until she is called back before scheduling her for a ED follow up appt.

## 2020-03-16 ENCOUNTER — Other Ambulatory Visit: Payer: Self-pay | Admitting: Advanced Practice Midwife

## 2020-03-16 ENCOUNTER — Other Ambulatory Visit: Payer: Medicaid Other

## 2020-03-16 DIAGNOSIS — O039 Complete or unspecified spontaneous abortion without complication: Secondary | ICD-10-CM

## 2020-03-16 MED ORDER — IBUPROFEN 800 MG PO TABS: 800.0000 mg | ORAL_TABLET | Freq: Three times a day (TID) | ORAL | 0 refills | Status: AC | PRN

## 2020-03-16 NOTE — Telephone Encounter (Signed)
I spoke with Stacy Palmer and answered her questions and reassurance was given. She only wants to see me for follow up. Please schedule her with me for next Tuesday, Wednesday or Thursday. After that I'll be away for a week.

## 2020-03-16 NOTE — Progress Notes (Signed)
Rx 800 mg Ibuprofen sent for patient. Answered patient questions and reassurance given regarding recent miscarriage, Rh factor, need for Rhogam etc.

## 2020-03-20 ENCOUNTER — Encounter: Admission: RE | Payer: Self-pay | Source: Home / Self Care

## 2020-03-20 ENCOUNTER — Ambulatory Visit
Admission: RE | Admit: 2020-03-20 | Payer: Medicaid Other | Source: Home / Self Care | Admitting: Obstetrics & Gynecology

## 2020-03-20 SURGERY — DILATION AND EVACUATION, UTERUS
Anesthesia: Choice

## 2020-03-22 ENCOUNTER — Ambulatory Visit: Payer: Medicaid Other | Admitting: Advanced Practice Midwife

## 2020-03-26 ENCOUNTER — Encounter: Payer: Medicaid Other | Admitting: Obstetrics and Gynecology

## 2021-03-21 IMAGING — US US OB COMP LESS 14 WK
1 series · 14 of 28 positions shown · non-contrast
Comparison: 01/30/2020.

CLINICAL DATA: Miscarriage. As per RN fetus found in patient's
underwear in waiting room and placed in container of formalin.

EXAM:
OBSTETRIC <14 WK ULTRASOUND
TECHNIQUE: Transabdominal ultrasound was performed for evaluation of the
gestation as well as the maternal uterus and adnexal regions.
Patient refused transvaginal exam.

[Series 1: us ob comp less 14 wks · 14 of 54 slices shown]
[im 2/54]
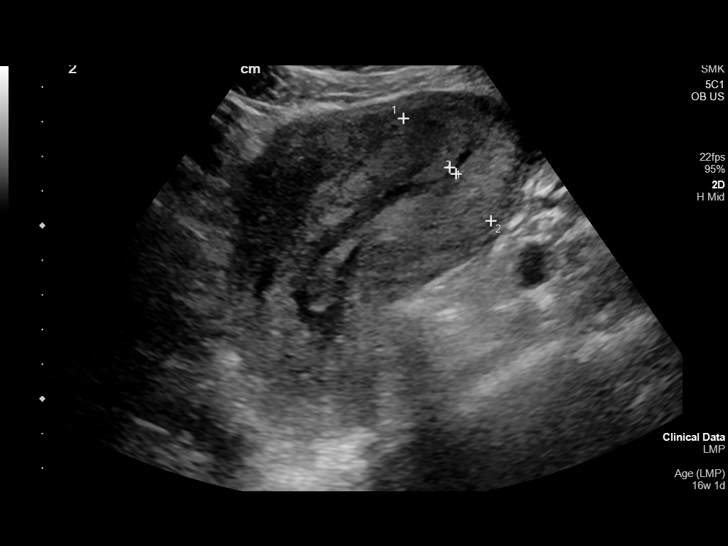
[im 6/54]
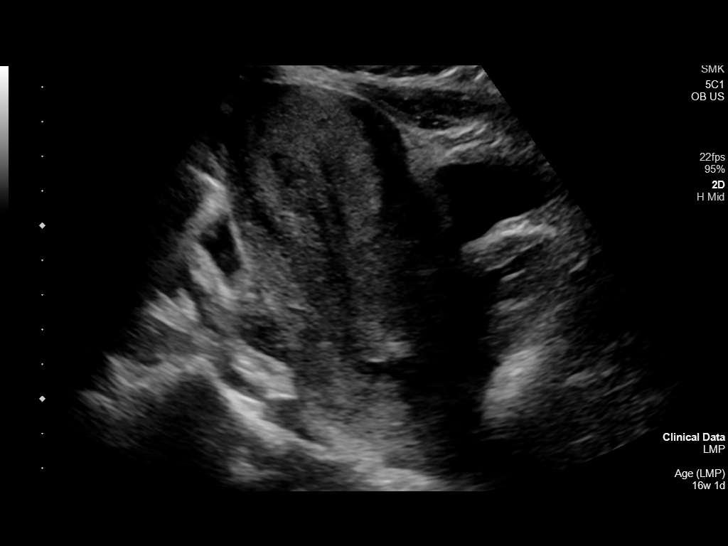
[im 10/54]
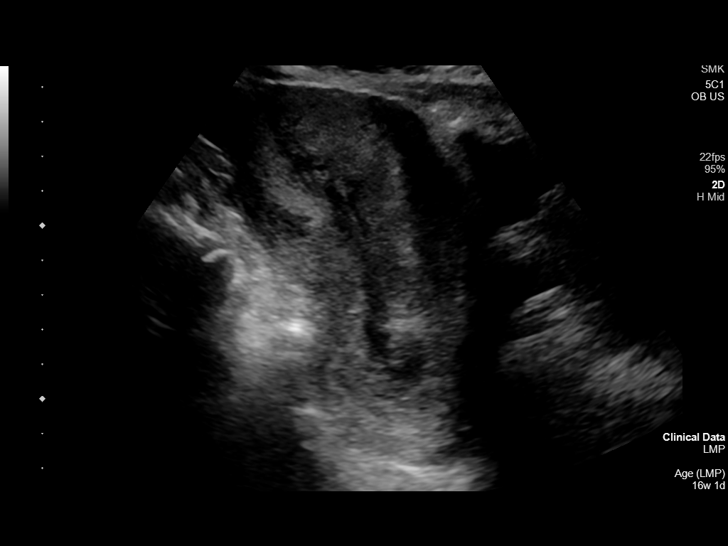
[im 14/54]
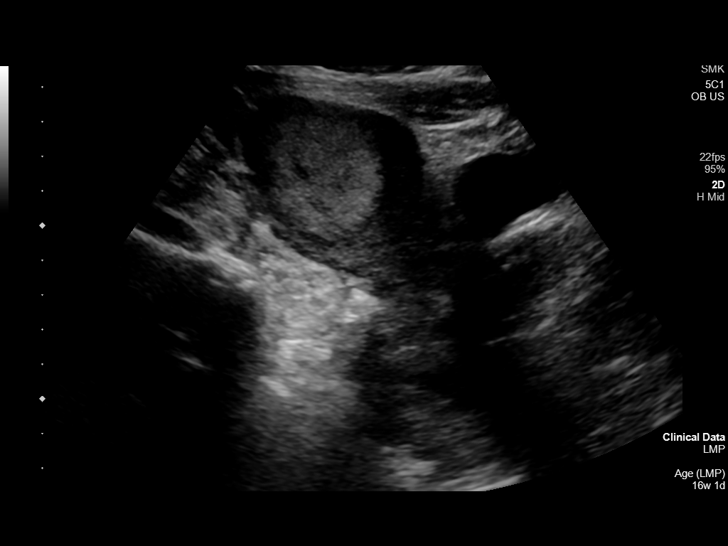
[im 18/54]
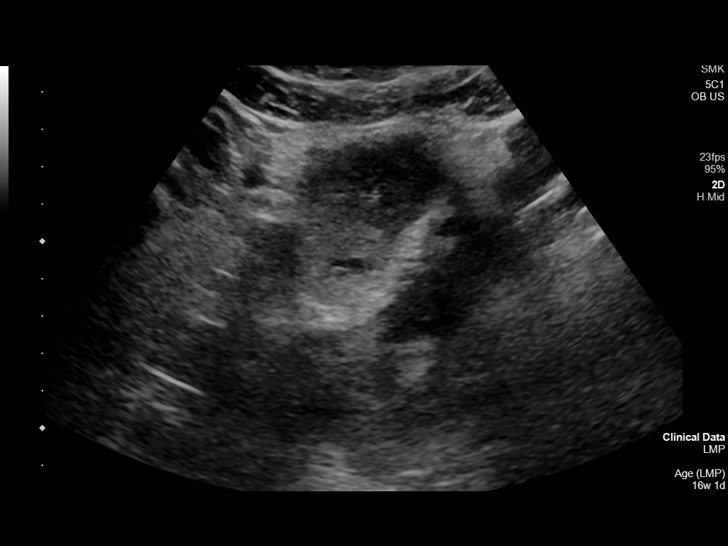
[im 22/54]
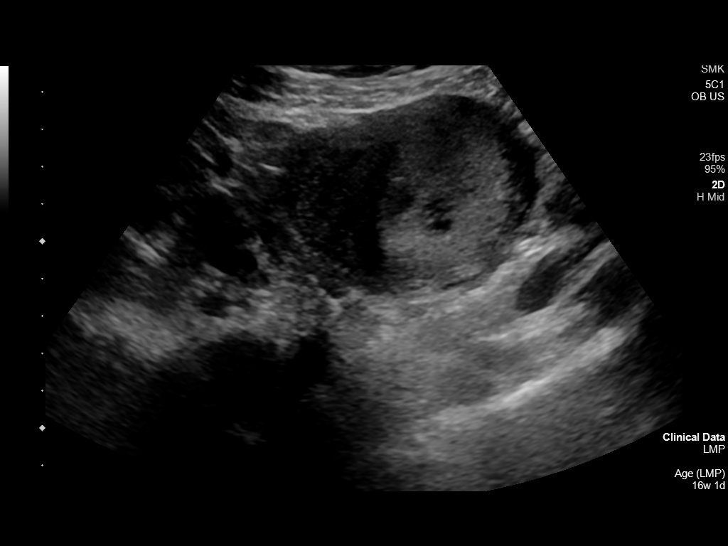
[im 26/54]
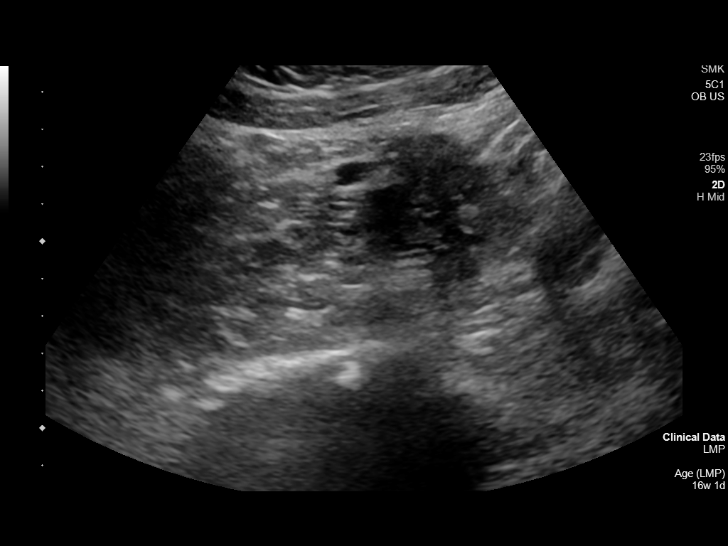
[im 30/54]
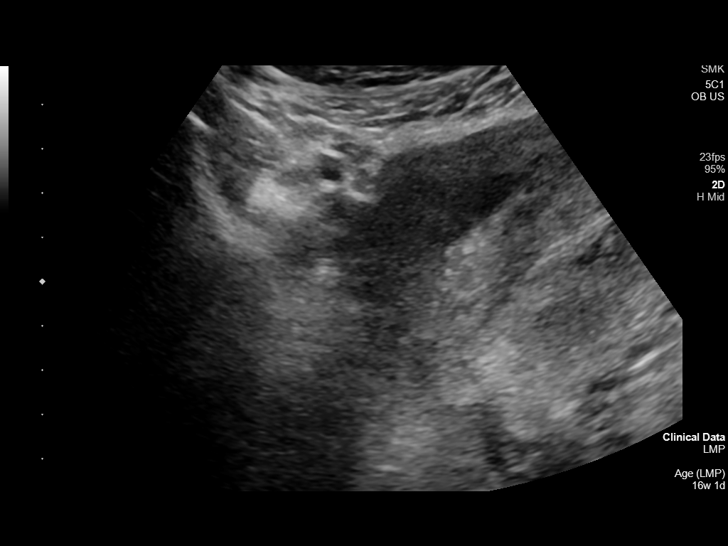
[im 34/54]
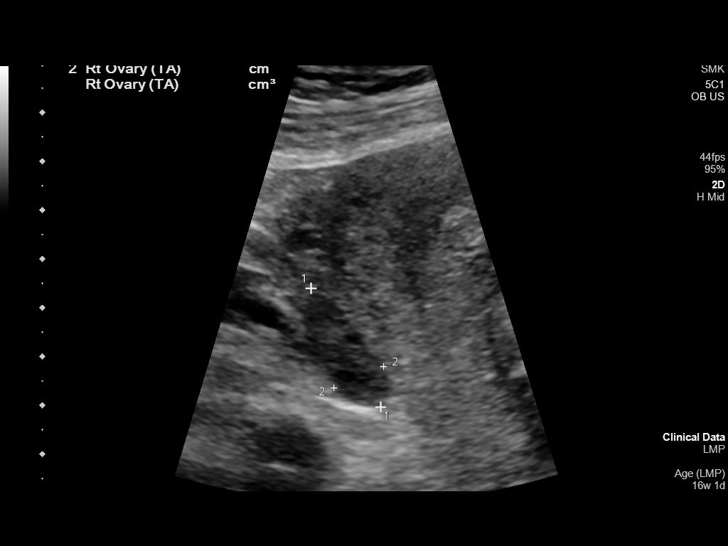
[im 38/54]
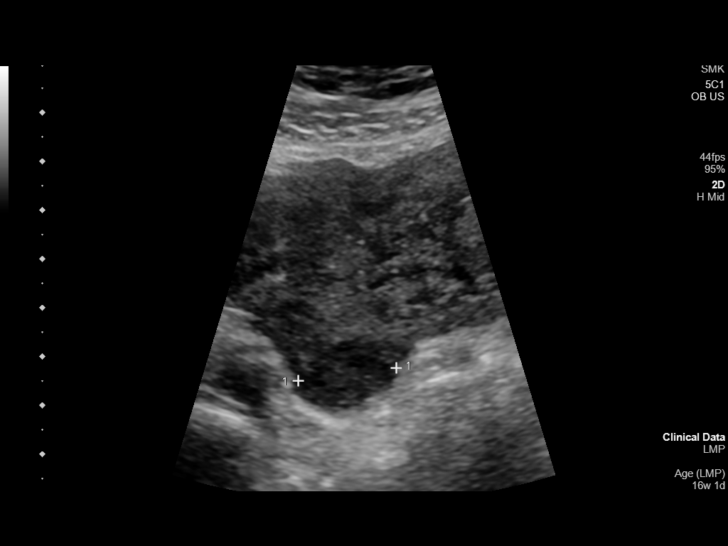
[im 42/54]
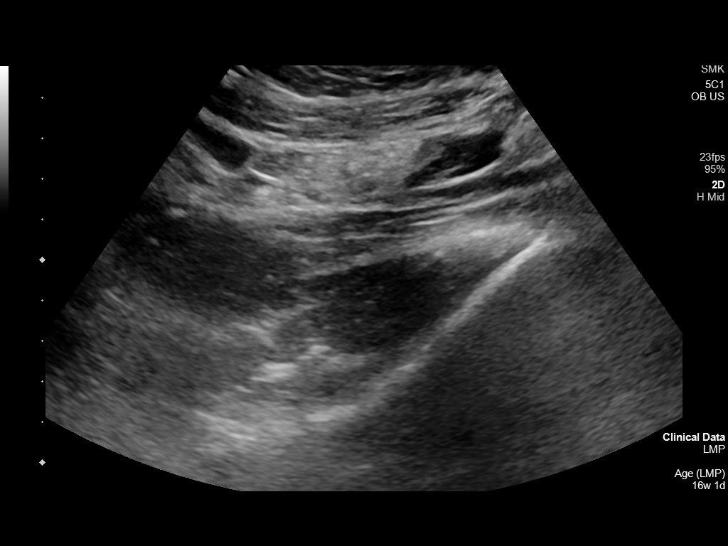
[im 46/54]
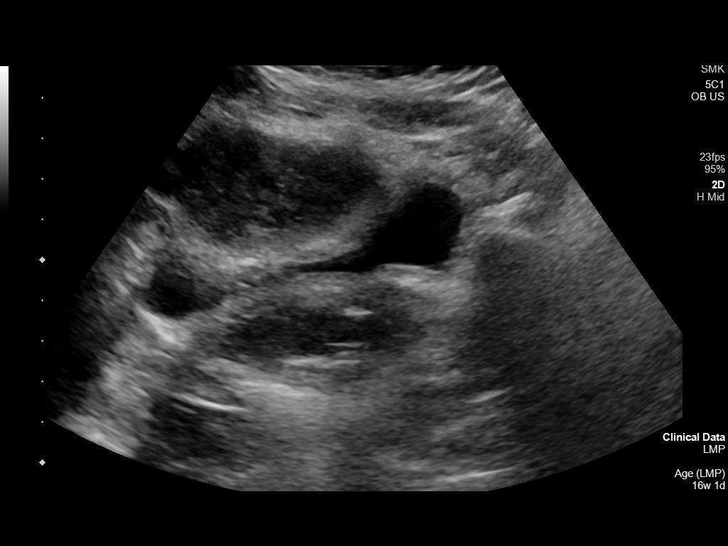
[im 50/54]
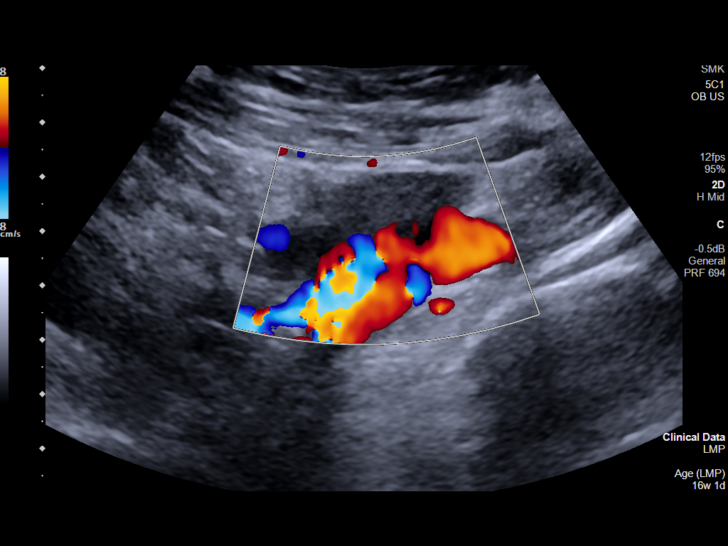
[im 54/54]
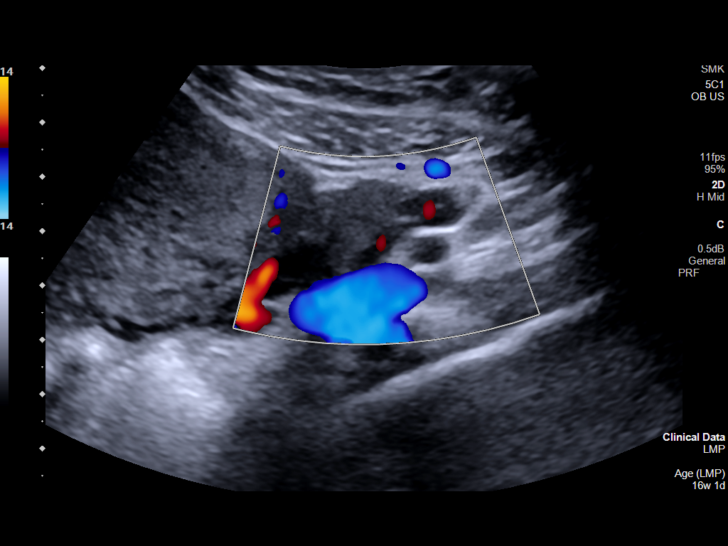

[14 of 28 positions shown; findings below may reference images not displayed]

FINDINGS: Intrauterine gestational sac: None visualized

Yolk sac: None visualized

Embryo:  None visualized

Cardiac Activity: None visualized

Subchorionic hemorrhage:  None visualized.

Maternal uterus/adnexae: Uterus 11.6 x 5.3 x 5.9 cm with a volume of
189.1 mL. No focal uterine abnormality identified. Endometrial
thickness is 36.4 mm. Right ovary measures 2.8 x 1.1 x 2.0 cm with a
volume of 3.3 mL. Left ovary measures 3.4 x 1.7 x 2.0 cm with a
volume of 6.02 mL. No focal ovarian abnormality identified. No free
pelvic fluid.
IMPRESSION: 1.  No intrauterine pregnancy noted.  See history above.

2. Endometrial thickening consistent with recent miscarriage.
Follow-up exam can be obtained to demonstrate return to normalcy. No
acute or focal abnormality identified. No free pelvic fluid.

## 2022-07-28 ENCOUNTER — Ambulatory Visit: Payer: Medicaid Other | Admitting: Family Medicine

## 2022-09-11 ENCOUNTER — Ambulatory Visit: Payer: Medicaid Other

## 2022-10-10 ENCOUNTER — Ambulatory Visit: Payer: Medicaid Other

## 2022-10-27 ENCOUNTER — Ambulatory Visit: Payer: Medicaid Other | Admitting: Family Medicine

## 2022-10-27 ENCOUNTER — Encounter: Payer: Self-pay | Admitting: Family Medicine

## 2022-10-27 DIAGNOSIS — Z113 Encounter for screening for infections with a predominantly sexual mode of transmission: Secondary | ICD-10-CM

## 2022-10-27 DIAGNOSIS — N898 Other specified noninflammatory disorders of vagina: Secondary | ICD-10-CM

## 2022-10-27 LAB — HM HIV SCREENING LAB: HM HIV Screening: NEGATIVE

## 2022-10-27 NOTE — Progress Notes (Unsigned)
[facility] Department  STI clinic/screening visit [address] [place] [address] [phone]  Subjective:  Stacy Palmer is a 32 y.o. female being seen today for an STI screening visit. The patient reports they do not have symptoms.  Patient reports that they and their partner  are fine with/without becoming  pregnant in the next year. They reported they are not interested in discussing contraception today.    Patient's last menstrual period was 10/14/2022 (exact date).  Patient has the following medical conditions:  There are no problems to display for this patient.   Chief Complaint  Patient presents with   SEXUALLY TRANSMITTED DISEASE    No symptoms    HPI  Patient reports no symptoms today presents for asymptomatic screening of STIs.   Does the patient using douching products? No  Last HIV test per patient/review of record was  Lab Results  Component Value Date   HMHIVSCREEN Negative - Validated 05/19/2018    Lab Results  Component Value Date   HIV Non Reactive 01/30/2020   Patient reports last pap was  Lab Results  Component Value Date   DIAGPAP  01/30/2020    - Negative for intraepithelial lesion or malignancy (NILM)   No results found for: "SPECADGYN"  Screening for MPX risk: Does the patient have an unexplained rash? No Is the patient MSM? No Does the patient endorse multiple sex partners or anonymous sex partners? No Did the patient have close or sexual contact with a person diagnosed with MPX? Not assessed.  Has the patient traveled outside the [place] where MPX is endemic? Not assessed.  Is there a high clinical suspicion for MPX-- evidenced by one of the following No  -Unlikely to be chickenpox  -Lymphadenopathy  -Rash that present in same phase of evolution on any given body part See flowsheet for further details and programmatic requirements.   Immunization history:   There is no immunization history on file for this patient.    The following portions of the patient's history were reviewed and updated as appropriate: allergies, current medications, past medical history, past social history, past surgical history and problem list.  Objective:  There were no vitals filed for this visit.  Physical Exam Vitals and nursing note reviewed. Exam conducted with a chaperone present Lenice Llamas, [place] chaperone present during exam.).  Constitutional:      Appearance: Normal appearance.  HENT:     Head: Normocephalic and atraumatic.     Mouth/Throat:     Lips: Pink.     Mouth: Mucous membranes are moist.     Tongue: No lesions. Tongue does not deviate from midline.     Palate: No lesions.     Pharynx: Oropharynx is clear. Uvula midline. No oropharyngeal exudate or posterior oropharyngeal erythema.     Tonsils: No tonsillar exudate or tonsillar abscesses.  Eyes:     General:        Right eye: No discharge.        Left eye: No discharge.  Pulmonary:     Effort: Pulmonary effort is normal.  Genitourinary:    General: Normal vulva.     Exam position: Lithotomy position.     Pubic Area: No rash or pubic lice.      Tanner stage (genital): 5.     Labia:        Right: No rash or lesion.        Left: No rash or lesion.  Vagina: Normal. No vaginal discharge, erythema, bleeding or lesions.     Cervix: No cervical motion tenderness, discharge, friability, lesion or erythema.     Uterus: Normal.      Adnexa: Right adnexa normal and left adnexa normal.     Rectum: Normal.     Comments: pH = <4.5 Moderate amount of non-odorous white vaginal discharge present.  Lymphadenopathy:     Head:     Right side of head: No submental, submandibular, tonsillar, preauricular or posterior auricular adenopathy.     Left side of head: No submental, submandibular, tonsillar, preauricular or posterior auricular adenopathy.     Cervical: No cervical adenopathy.     Right cervical: No superficial or posterior cervical adenopathy.     Left cervical: No superficial or posterior cervical adenopathy.     Upper Body:     Right upper body: No supraclavicular, axillary or epitrochlear adenopathy.     Left upper body: No supraclavicular, axillary or epitrochlear adenopathy.     Lower Body: No right inguinal adenopathy. No left inguinal adenopathy.  Skin:    General: Skin is warm and dry.     Findings: No rash.     Comments: Skin color appropriate for ethnicity.   Neurological:     Mental Status: She is alert and oriented to person, place, and time.  Psychiatric:        Attention and Perception: Attention normal.        Mood and Affect: Mood normal.        Speech: Speech normal.        Behavior: Behavior normal.      Assessment and Plan:  [name] is a 32 y.o. female presenting to the [facility] Department for STI screening  1. Screening for venereal disease  - Chlamydia/Gonorrhea [facility] - HIV [facility] - Syphilis Serology, [place] [facility] - WET PREP FOR TRICH, YEAST, CLUE - Gonococcus culture  2. Vaginal leukorrhea Wet prep performed.    Patient accepted all screenings including oral, vaginal CT/GC and bloodwork for HIV/RPR, and wet prep. Patient meets criteria for HepB screening? No. Ordered? not applicable Patient meets criteria for HepC screening? No. Ordered? not applicable  Treat wet prep per standing order Discussed time line for [facility] results and that patient will be called with positive results and encouraged patient to call if she had not heard in 2 weeks.  Counseled to return or seek care for continued or worsening symptoms Recommended repeat testing in 3 months with positive results. Recommended condom use with all sex  Patient is currently NOT using  anything  to prevent pregnancy.    Return if symptoms worsen or fail to improve.  No future appointments. Time with patient 20 minutes.   Edmonia James, NP

## 2022-10-27 NOTE — Progress Notes (Signed)
Pt is here for std screening.  Wet prep results reviewed with pt, no treatment required per standing order.  Condoms declined. Gaspar Garbe, RN

## 2022-10-28 LAB — WET PREP FOR TRICH, YEAST, CLUE
Trichomonas Exam: NEGATIVE
Yeast Exam: NEGATIVE

## 2022-10-28 NOTE — Progress Notes (Signed)

## 2022-11-01 LAB — GONOCOCCUS CULTURE

## 2023-05-27 ENCOUNTER — Ambulatory Visit

## 2023-05-27 VITALS — BP 118/60 | Ht 62.0 in | Wt 163.5 lb

## 2023-05-27 DIAGNOSIS — Z309 Encounter for contraceptive management, unspecified: Secondary | ICD-10-CM | POA: Diagnosis not present

## 2023-05-27 DIAGNOSIS — Z3201 Encounter for pregnancy test, result positive: Secondary | ICD-10-CM

## 2023-05-27 LAB — PREGNANCY, URINE: Preg Test, Ur: POSITIVE — AB

## 2023-05-27 MED ORDER — PRENATAL 27-0.8 MG PO TABS: 1.0000 | ORAL_TABLET | Freq: Every day | ORAL | Status: AC

## 2023-05-27 NOTE — Progress Notes (Signed)
 UPT positive. Unsure where she plans prenatal care. Local prenatal resource list given and reviewed. Advised to establish care soon. Positive preg packet given and reviewed.  The patient was dispensed prenatal vitamins #100 today per SO Dr Lorrin Mais. I provided counseling today regarding the medication. We discussed the medication, the side effects and when to call clinic. Patient given the opportunity to ask questions. Questions answered.    Patient walked to Tisha's office for medicaid/preg women assessment. Jerel Shepherd, RN

## 2023-06-15 DIAGNOSIS — O0993 Supervision of high risk pregnancy, unspecified, third trimester: Secondary | ICD-10-CM

## 2023-07-06 LAB — OB RESULTS CONSOLE RUBELLA ANTIBODY, IGM: Rubella: IMMUNE

## 2023-07-06 LAB — OB RESULTS CONSOLE VARICELLA ZOSTER ANTIBODY, IGG: Varicella: IMMUNE

## 2023-07-06 LAB — OB RESULTS CONSOLE HIV ANTIBODY (ROUTINE TESTING): HIV: NONREACTIVE

## 2023-07-06 LAB — OB RESULTS CONSOLE HEPATITIS B SURFACE ANTIGEN: Hepatitis B Surface Ag: NEGATIVE

## 2023-07-06 LAB — OB RESULTS CONSOLE RPR: RPR: NONREACTIVE

## 2023-08-05 DIAGNOSIS — R7989 Other specified abnormal findings of blood chemistry: Secondary | ICD-10-CM | POA: Insufficient documentation

## 2023-08-05 DIAGNOSIS — Z6791 Unspecified blood type, Rh negative: Secondary | ICD-10-CM | POA: Insufficient documentation

## 2023-08-05 DIAGNOSIS — O99212 Obesity complicating pregnancy, second trimester: Secondary | ICD-10-CM | POA: Insufficient documentation

## 2023-08-05 DIAGNOSIS — F1290 Cannabis use, unspecified, uncomplicated: Secondary | ICD-10-CM | POA: Insufficient documentation

## 2023-09-02 DIAGNOSIS — O4440 Low lying placenta NOS or without hemorrhage, unspecified trimester: Secondary | ICD-10-CM | POA: Insufficient documentation

## 2023-12-01 DIAGNOSIS — O99013 Anemia complicating pregnancy, third trimester: Secondary | ICD-10-CM | POA: Insufficient documentation

## 2023-12-04 ENCOUNTER — Encounter: Payer: Self-pay | Admitting: Obstetrics

## 2023-12-08 ENCOUNTER — Other Ambulatory Visit: Payer: Self-pay | Admitting: Obstetrics

## 2023-12-08 DIAGNOSIS — D509 Iron deficiency anemia, unspecified: Secondary | ICD-10-CM | POA: Insufficient documentation

## 2023-12-08 NOTE — Progress Notes (Signed)
 Patient is 33.[redacted] weeks pregnant with maternal iron deficiency anemia, IV Venofer ordered x 4 doses  Claudie Brickhouse CNM

## 2023-12-09 ENCOUNTER — Telehealth (HOSPITAL_COMMUNITY): Payer: Self-pay | Admitting: Pharmacy Technician

## 2023-12-09 NOTE — Telephone Encounter (Addendum)
 Auth Submission: NO AUTH NEEDED Site of care: [facility] Payer: [employer]  Medication & CPT/J Code(s) submitted: Venofer (Iron Sucrose) J1756 Diagnosis Code: D50.9 Route of submission (phone, fax, portal):  Phone # Fax # Auth type: Buy/Bill HB Units/visits requested: 300mg  x 3 doses Reference number:  Approval from: 12/09/2023 to 02/17/24      Dagoberto Armour, CPhT [facility] Phone: [phone] 12/09/2023

## 2023-12-17 ENCOUNTER — Ambulatory Visit
Admission: RE | Admit: 2023-12-17 | Discharge: 2023-12-17 | Disposition: A | Discharge status: Home / Self Care | Source: Ambulatory Visit | Attending: Obstetrics | Admitting: Obstetrics

## 2023-12-17 VITALS — BP 133/87 | HR 107 | Temp 98.8°F | Resp 18 | Ht 62.0 in | Wt 188.0 lb

## 2023-12-17 DIAGNOSIS — D509 Iron deficiency anemia, unspecified: Secondary | ICD-10-CM | POA: Diagnosis present

## 2023-12-17 MED ORDER — IRON SUCROSE 300 MG IVPB - SIMPLE MED
300.0000 mg | Freq: Once | Status: AC
  Administered 2023-12-17: 300 mg via INTRAVENOUS
  Filled 2023-12-17: qty 300
  Filled 2023-12-17: qty 265

## 2023-12-24 ENCOUNTER — Ambulatory Visit
Admission: RE | Admit: 2023-12-24 | Discharge: 2023-12-24 | Disposition: A | Discharge status: Home / Self Care | Source: Ambulatory Visit | Attending: Obstetrics | Admitting: Obstetrics

## 2023-12-24 VITALS — BP 135/90 | HR 99 | Temp 97.5°F | Resp 15

## 2023-12-24 DIAGNOSIS — D509 Iron deficiency anemia, unspecified: Secondary | ICD-10-CM | POA: Insufficient documentation

## 2023-12-24 MED ORDER — IRON SUCROSE 300 MG IVPB - SIMPLE MED
300.0000 mg | Freq: Once | Status: AC
  Administered 2023-12-24: 300 mg via INTRAVENOUS
  Filled 2023-12-24: qty 300

## 2023-12-25 ENCOUNTER — Ambulatory Visit: Admission: RE | Admit: 2023-12-25 | Source: Ambulatory Visit

## 2023-12-31 ENCOUNTER — Ambulatory Visit: Admission: RE | Admit: 2023-12-31 | Source: Ambulatory Visit

## 2023-12-31 LAB — OB RESULTS CONSOLE GC/CHLAMYDIA
Chlamydia: NEGATIVE
Neisseria Gonorrhea: NEGATIVE

## 2023-12-31 LAB — OB RESULTS CONSOLE GBS: GBS: NEGATIVE

## 2024-01-01 ENCOUNTER — Ambulatory Visit
Admission: RE | Admit: 2024-01-01 | Discharge: 2024-01-01 | Disposition: A | Discharge status: Home / Self Care | Source: Ambulatory Visit | Attending: Obstetrics | Admitting: Obstetrics

## 2024-01-01 VITALS — BP 120/65 | HR 103 | Temp 97.6°F | Resp 18 | Ht 62.0 in

## 2024-01-01 DIAGNOSIS — D509 Iron deficiency anemia, unspecified: Secondary | ICD-10-CM | POA: Insufficient documentation

## 2024-01-01 MED ORDER — IRON SUCROSE 300 MG IVPB - SIMPLE MED
300.0000 mg | Freq: Once | Status: AC
  Administered 2024-01-01: 300 mg via INTRAVENOUS
  Filled 2024-01-01: qty 300

## 2024-01-08 NOTE — Progress Notes (Signed)
 Obstetrics & Gynecology Office Visit  Subjective  Stacy Palmer is a 33 y.o. G2P0010 at [redacted]w[redacted]d being seen today for ongoing prenatal care.  She is currently monitored for tthis high-risk pregnancy.  Patient's last menstrual period was 04/18/2023 (exact date). Estimated Date of Delivery: 01/23/24  History of Present Illness   Patient concerns today: doing well  Pt denies contractions, vaginal bleeding, leaking fluid. Endorses good fetal movementfrequent Pt denies HA, VD or RUQ pain.   Objective  BP 124/89   Pulse 97   Wt 83 kg (183 lb)   LMP 04/18/2023 (Exact Date)   BMI 34.02 kg/m    Fetal Status:          Pre-pregnant weight: 73.7 kg (162 lb 7.7 oz)  TWG: 9.308 kg (20 lb 8.3 oz)  PE Chaperone note: A chaperone was included for sensitive portions of the exam- staff member name: D Corson CMA  Chaperone present for pelvic exam.  Gen: NAD  Pulm: No use of accessory muscles, normal respirations Abdomen: Gravid, nontender Ext : LE edema, no rashes.   Psych: Mood, insight, judgement intact SVE: deferred FHT by doppler: 150 bpm distinguished from mom Fundal height: 38 cm S=D  Assessment   33 y.o. G2P0010 at [redacted]w[redacted]d by  01/23/2024, by Last Menstrual Period presenting for work-in prenatal visit  Plan   Problem list reviewed and/or updated   Assessment & Plan     Orders Placed This Encounter  Procedures  . CBC w/auto Differential (5 Part)    Release to patient:   Immediate    1. Supervision of high risk pregnancy in third trimester (HHS-HCC)   - GBS results: neg - Labor plans reviewed: yes  - Kick counts reviewed with the patient in detail.  Patient instructed to assess for fetal activity daily at regular intervals. Counts to be done if decreased activity noted. Patient instructed to contact the office if counts do not reveal adequate movements.  - Labor precautions (ROM, vaginal bleeding, signs and symptoms of labor), along with how and when to call the office,  reviewed. Patient verbalized understanding.  - Return in about 1 week (around 01/15/2024) for routine ob visit .  -     CBC w/auto Differential (5 Part)  2. Rh negative status during pregnancy in third trimester (HHS-HCC) -Rhogam given at 28 weeks  3. Obesity affecting pregnancy in second trimester, unspecified obesity type (HHS-HCC) -9.308 kg (20 lb 8.3 oz)   4. Low TSH level   5. Anemia during pregnancy in third trimester (HHS-HCC) -completed iron  transfusion on 01/01/24  6. Transverse position -Cephalic on ultrasound at 36 weeks    Call with bleeding, contractions, PROM, or decreased fetal movement.   Return in about 1 week (around 01/15/2024) for routine ob visit .   Attestation Statement:   I personally performed the service, non-incident to. (WP)   FELICIA L DICKERSON, CNM

## 2024-01-15 ENCOUNTER — Observation Stay
Admission: EM | Admit: 2024-01-15 | Discharge: 2024-01-15 | Disposition: A | Discharge status: Home / Self Care | Attending: Certified Nurse Midwife | Admitting: Obstetrics and Gynecology

## 2024-01-15 ENCOUNTER — Encounter: Payer: Self-pay | Admitting: Obstetrics and Gynecology

## 2024-01-15 ENCOUNTER — Other Ambulatory Visit: Payer: Self-pay

## 2024-01-15 DIAGNOSIS — O4693 Antepartum hemorrhage, unspecified, third trimester: Secondary | ICD-10-CM | POA: Diagnosis present

## 2024-01-15 DIAGNOSIS — O4690 Antepartum hemorrhage, unspecified, unspecified trimester: Principal | ICD-10-CM | POA: Diagnosis present

## 2024-01-15 DIAGNOSIS — Z3A38 38 weeks gestation of pregnancy: Secondary | ICD-10-CM | POA: Diagnosis not present

## 2024-01-15 LAB — WET PREP, GENITAL
Clue Cells Wet Prep HPF POC: NONE SEEN
Sperm: NONE SEEN
Trich, Wet Prep: NONE SEEN
WBC, Wet Prep HPF POC: <10 (ref <10)
Yeast Wet Prep HPF POC: NONE SEEN

## 2024-01-15 LAB — URINALYSIS, ROUTINE W REFLEX MICROSCOPIC
Bacteria, UA: NONE SEEN
Bilirubin Urine: NEGATIVE
Glucose, UA: NEGATIVE mg/dL
Ketones, ur: NEGATIVE mg/dL
Leukocytes,Ua: NEGATIVE
Nitrite: NEGATIVE
Protein, ur: NEGATIVE mg/dL
Specific Gravity, Urine: 1.008 (ref 1.005–1.030)
pH: 6 (ref 5.0–8.0)

## 2024-01-15 MED ORDER — RHO D IMMUNE GLOBULIN 1500 UNIT/2ML IJ SOSY
300.0000 ug | PREFILLED_SYRINGE | Freq: Once | INTRAMUSCULAR | Status: DC
  Filled 2024-01-15: qty 2

## 2024-01-15 MED ORDER — CALCIUM CARBONATE ANTACID 500 MG PO CHEW: 2.0000 | CHEWABLE_TABLET | ORAL | Status: DC | PRN

## 2024-01-15 MED ORDER — ACETAMINOPHEN 325 MG PO TABS: 650.0000 mg | ORAL_TABLET | ORAL | Status: DC | PRN

## 2024-01-15 NOTE — Discharge Summary (Cosign Needed Addendum)
 Stacy Palmer is a 33 y.o. female. She is at [redacted]w[redacted]d gestation. Patient's last menstrual period was 04/18/2023. Estimated Date of Delivery: 01/23/24  Prenatal care site: [facility]   Current pregnancy complicated by:  - low-lying placenta - transverse lie of the fetus - obesity - marijuana use in pregnancy - Rh negative - anemia  Chief complaint: vaginal bleeding in pregnancy  She reports today she experienced bright red vaginal bleeding and a small clot when she wiped. She noted some more pink discharge throughout the afternoon when she wiped but did not experience any more blood clots or bright red bleeding. She reports good fetal movement and denies contractions or leaking fluid. She denies any pain.   S: Resting comfortably. no CTX, scant VB.no LOF,  Active fetal movement.  Denies: HA, visual changes, SOB, or RUQ/epigastric pain  Maternal Medical History:   Past Medical History:  Diagnosis Date   Menorrhagia 2013    Past Surgical History:  Procedure Laterality Date   denies      No Known Allergies  Prior to Admission medications   Medication Sig Start Date End Date Taking? Authorizing Provider  Prenatal Vit-Fe Fumarate-FA (PRENATAL MULTIVITAMIN) TABS tablet Take 2 tablets by mouth daily at 12 noon.   Yes [provider]  ibuprofen  (ADVIL ) 800 MG tablet Take 1 tablet (800 mg total) by mouth every 8 (eight) hours as needed for cramping. Patient not taking: Reported on 10/27/2022 03/16/20   Lynda Bradley, CNM  ondansetron  (ZOFRAN  ODT) 4 MG disintegrating tablet Take 1 tablet (4 mg total) by mouth every 6 (six) hours as needed for nausea. Patient not taking: Reported on 10/27/2022 02/27/20   Schuman, Christanna R, MD  promethazine  (PHENERGAN ) 25 MG tablet Take 1 tablet (25 mg total) by mouth every 6 (six) hours as needed for nausea or vomiting. Patient not taking: Reported on 10/27/2022 02/27/20   Victor Claudell SAUNDERS, MD    Social History: She  reports that she  has never smoked. She has never used smokeless tobacco. She reports that she does not currently use alcohol. She reports that she does not currently use drugs after having used the following drugs: Marijuana.  Family History: family history includes Diabetes in her mother; Hypertension in her maternal grandmother, mother, and paternal grandmother; Migraines in her mother.  no history of gyn cancers  Review of Systems: A full review of systems was performed and negative except as noted in the HPI.     O:  BP 125/82 (BP Location: Left Arm)   Pulse 90   Temp 98.2 F (36.8 C) (Oral)   Resp 16   Ht 5' 2 (1.575 m)   Wt 83 kg   LMP 04/18/2023   BMI 33.47 kg/m  Results for orders placed or performed during the hospital encounter of 01/15/24 (from the past 48 hours)  Wet prep, genital   Collection Time: 01/15/24  5:39 PM  Result Value Ref Range   Yeast Wet Prep HPF POC NONE SEEN NONE SEEN   Trich, Wet Prep NONE SEEN NONE SEEN   Clue Cells Wet Prep HPF POC NONE SEEN NONE SEEN   WBC, Wet Prep HPF POC <10 <10   Sperm NONE SEEN   Urinalysis, Routine w reflex microscopic -Urine, Clean Catch   Collection Time: 01/15/24  5:39 PM  Result Value Ref Range   Color, Urine STRAW (A) YELLOW   APPearance CLEAR (A) CLEAR   Specific Gravity, Urine 1.008 1.005 - 1.030   pH 6.0  5.0 - 8.0   Glucose, UA NEGATIVE NEGATIVE mg/dL   Hgb urine dipstick MODERATE (A) NEGATIVE   Bilirubin Urine NEGATIVE NEGATIVE   Ketones, ur NEGATIVE NEGATIVE mg/dL   Protein, ur NEGATIVE NEGATIVE mg/dL   Nitrite NEGATIVE NEGATIVE   Leukocytes,Ua NEGATIVE NEGATIVE   RBC / HPF 0-5 0 - 5 RBC/hpf   WBC, UA 6-10 0 - 5 WBC/hpf   Bacteria, UA NONE SEEN NONE SEEN   Squamous Epithelial / HPF 0-5 0 - 5 /HPF   Mucus PRESENT      Constitutional: NAD, AAOx3  HE/ENT: extraocular movements grossly intact, moist mucous membranes CV: RRR PULM: nl respiratory effort, CTABL     Abd: gravid, non-tender, non-distended, soft      Ext:  Non-tender, Nonedematous   Psych: mood appropriate, speech normal Pelvic: SSE done cervix closed/thick/high, small amount of old brownish blood present  Pelvic exam: normal external genitalia, vulva, vagina, cervix, uterus and adnexa.  Fetal  monitoring: Cat 1 Appropriate for GA Baseline: 135bpm Variability: moderate Accelerations: present x >2 Decelerations absent Contractions: none Time 4 hours  A/P: 33 y.o. [redacted]w[redacted]d here for antenatal surveillance for vaginal bleeding in pregnancy  Principle Diagnosis:  Vaginal bleeding in pregnancy, low-lying placenta  Vaginal bleeding with low-lying placenta: cervix closed/thick/high, no contractions noted on monitor or reported by patient. Wet prep and UA WNL. No active bleeding noted. Small amounts of old blood present. Repeated speculum exam 2hrs later did not show any increase in bleeding and no bright red blood or change in cervical exam. Rhogam d/t Rh negative. Discussed with Dr. JONETTA. Schermerhorn and since no active or bright red bleeding after several hours, patient can discharge but return immediately if bleeding reoccurs or she begins having painful contractions. Patient verbalized understanding and agreement with the plan of care and verbalizes that she will return if she experiences new bleeding or any pain. Primary c/s scheduled for 01/19/24. Labor: not present.  Fetal Wellbeing: Reassuring Cat 1 tracing. D/c home stable, precautions reviewed, follow-up as scheduled.    Edsel Charlies Blush, CNM 01/15/2024 8:40 PM

## 2024-01-15 NOTE — OB Triage Note (Signed)
 Pt is a G2P0 at [redacted]w[redacted]d gestation presenting to l/d triage due to vaginal bleeding.Pt states she noted brown discharge when wiping this morning. Around bleeding earlier this morning around 0900 with brown discharge on toilet paper. She stated around 1315 she went to the bathroom again and noted bright red bleeding on the toilet paper with one small sized blood clot present. Pt applied a peri pad, and has since noted very light pink discharge, stating it is just enough to see.  She denies recent sexual intercourse and she reports having a cervical exam on 11/21.  She denies LOF & pain. She endorses feeling +FM. VSS.  Monitors applied and assessing with initial fht 145 at 1641.  Edsel Blush CNM aware of pt arrival to unit.

## 2024-01-18 ENCOUNTER — Encounter
Admission: RE | Admit: 2024-01-18 | Discharge: 2024-01-18 | Disposition: A | Source: Ambulatory Visit | Attending: Obstetrics and Gynecology | Admitting: Obstetrics and Gynecology

## 2024-01-18 ENCOUNTER — Other Ambulatory Visit: Payer: Self-pay

## 2024-01-18 ENCOUNTER — Other Ambulatory Visit

## 2024-01-18 DIAGNOSIS — D508 Other iron deficiency anemias: Secondary | ICD-10-CM | POA: Insufficient documentation

## 2024-01-18 DIAGNOSIS — Z01812 Encounter for preprocedural laboratory examination: Secondary | ICD-10-CM | POA: Insufficient documentation

## 2024-01-18 LAB — CBC
HCT: 36.3 % (ref 36.0–46.0)
Hemoglobin: 11.7 g/dL — ABNORMAL LOW (ref 12.0–15.0)
MCH: 28.2 pg (ref 26.0–34.0)
MCHC: 32.2 g/dL (ref 30.0–36.0)
MCV: 87.5 fL (ref 80.0–100.0)
Platelets: 326 K/uL (ref 150–400)
RBC: 4.15 MIL/uL (ref 3.87–5.11)
RDW: 18.9 % — ABNORMAL HIGH (ref 11.5–15.5)
WBC: 8.9 K/uL (ref 4.0–10.5)
nRBC: 0 % (ref 0.0–0.2)

## 2024-01-18 LAB — TYPE AND SCREEN
ABO/RH(D): O NEG
Antibody Screen: NEGATIVE
Extend sample reason: UNDETERMINED

## 2024-01-18 MED ORDER — POVIDONE-IODINE 10 % EX SWAB: 2.0000 | Freq: Once | CUTANEOUS | Status: DC

## 2024-01-18 MED ORDER — LACTATED RINGERS IV SOLN: INTRAVENOUS | Status: DC

## 2024-01-18 MED ORDER — CHLORHEXIDINE GLUCONATE 0.12 % MT SOLN
15.0000 mL | Freq: Once | OROMUCOSAL | Status: AC
  Administered 2024-01-19: 15 mL via OROMUCOSAL
  Filled 2024-01-18: qty 15

## 2024-01-18 MED ORDER — CEFAZOLIN SODIUM-DEXTROSE 2-4 GM/100ML-% IV SOLN
2.0000 g | INTRAVENOUS | Status: AC
  Administered 2024-01-19: 2 g via INTRAVENOUS
  Filled 2024-01-18: qty 100

## 2024-01-18 MED ORDER — LACTATED RINGERS IV SOLN: Freq: Once | INTRAVENOUS | Status: DC

## 2024-01-18 MED ORDER — ORAL CARE MOUTH RINSE: 15.0000 mL | Freq: Once | OROMUCOSAL | Status: AC

## 2024-01-18 NOTE — Patient Instructions (Addendum)
 Arrival Time:  9:00 am ,  Please call Labor and Delivery the day before your scheduled C-Section to find out your arrival time. [phone].  Arrival: If your arrival time is prior to 6:00 am, please enter through the Emergency Room Entrance and you will be directed to Labor and Delivery. If your arrival time is 6:00 am or later, please enter the Medical Mall and follow the greeter's instructions.  REMEMBER: Instructions that are not followed completely may result in serious medical risk, up to and including death; or upon the discretion of your surgeon and anesthesiologist your surgery may need to be rescheduled.  Do not eat food or drink any liquids after midnight the night before surgery.  No gum chewing or hard candies.  One week prior to surgery: Stop Anti-inflammatories (NSAIDS) such as Advil , Aleve, Ibuprofen , Motrin , Naproxen, Naprosyn and Aspirin based products such as Excedrin, Goody's Powder, BC Powder. You may take Tylenol  if needed for pain up until the day of surgery.  Stop ANY OVER THE COUNTER supplements until after surgery.  ON THE DAY OF SURGERY ONLY TAKE THESE MEDICATIONS WITH SIPS OF WATER:  none   No Alcohol for 24 hours before or after surgery.  No Smoking including e-cigarettes for 24 hours before surgery.  No chewable tobacco products for at least 6 hours before surgery.  No nicotine patches on the day of surgery.  Do not use any recreational drugs for at least a week (preferably 2 weeks) before your surgery.  Please be advised that the combination of cocaine and anesthesia may have negative outcomes, up to and including death. If you test positive for cocaine, your surgery will be cancelled.  On the morning of surgery brush your teeth with toothpaste and water, you may rinse your mouth with mouthwash if you wish. Do not swallow any toothpaste or mouthwash.  Use CHG Soap or wipes as directed on instruction sheet.  Do not wear jewelry, make-up, hairpins,  clips or nail polish.  For welded (permanent) jewelry: bracelets, anklets, waist bands, etc.  Please have this removed prior to surgery.  If it is not removed, there is a chance that hospital personnel will need to cut it off on the day of surgery.  Do not wear lotions, powders, or perfumes.   Do not shave body hair from the neck down 48 hours before surgery.  Contact lenses, hearing aids and dentures may not be worn into surgery.  Do not bring valuables to the hospital. [facility] is not responsible for any missing/lost belongings or valuables.   Notify your doctor if there is any change in your medical condition (cold, fever, infection).  Wear comfortable clothing (specific to your surgery type) to the hospital.  After surgery, you can help prevent lung complications by doing breathing exercises.  Take deep breaths and cough every 1-2 hours. Your doctor may order a device called an Incentive Spirometer to help you take deep breaths.  When coughing or sneezing, hold a pillow firmly against your incision with both hands. This is called "splinting." Doing this helps protect your incision. It also decreases belly discomfort.  If you are being admitted to the hospital overnight, leave your suitcase in the car. After surgery it may be brought to your room.  In case of increased patient census, it may be necessary for you, the patient, to continue your postoperative care in the Same Day Surgery department.  If you are being discharged the day of surgery, you will not be allowed  to drive home. You will need a responsible individual to drive you home and stay with you for 24 hours after surgery.   If you are taking public transportation, you will need to have a responsible individual with you.  Please call the Pre-admissions Testing Dept. at [phone] if you have any questions about these instructions.  Surgery Visitation Policy:  Labor & Delivery  Laboring women may have one  designated support person and two other visitors of any age visit. The support person must remain the same. The visitors may switch with other visitors. Visitation is permitted 24 hours per day. The designated support person or a visitor over the age of 16 may sleep overnight in the patient's room. A doula registered with  [facility] for labor and delivery support is not considered a visitor. Doulas not registered with  are considered visitors.  Mother Baby Unit, OB Specialty and Gynecological Care  A designated support person and three visitors of any age may visit. The three visitors may switch out. The designated support person or a visitor age 73 or older may stay overnight in the room. During the postpartum period (up to 6 weeks), if the mother is the patient, she can have her newborn stay with her if there is another support person present who can be responsible for the baby.   Patients having surgery or a procedure may have two visitors.  Children under the age of 39 must have an adult with them who is not the patient.  Inpatient Visitation:    Visiting hours are 7 a.m. to 8 p.m. Up to four visitors are allowed at one time in a patient room. The visitors may rotate out with other people during the day.  One visitor age 31 or older may stay with the patient overnight and must be in the room by 8 p.m.   Merchandiser, Retail to address health-related social needs:  [email or web address]                                                                                                            Preparing for Surgery with CHLORHEXIDINE GLUCONATE (CHG) Soap  Chlorhexidine Gluconate (CHG) Soap  o An antiseptic cleaner that kills germs and bonds with the skin to continue killing germs even after washing  o Used for showering the night before surgery and morning of surgery  Before surgery, you can play an important role by reducing the number of germs on  your skin.  CHG (Chlorhexidine gluconate) soap is an antiseptic cleanser which kills germs and bonds with the skin to continue killing germs even after washing.  Please do not use if you have an allergy to CHG or antibacterial soaps. If your skin becomes reddened/irritated stop using the CHG.  1. Shower the NIGHT BEFORE SURGERY with CHG soap.  2. If you choose to wash your hair, wash your hair first as usual with your normal shampoo.  3. After shampooing, rinse your hair and body thoroughly to remove the shampoo.  4. Use CHG as  you would any other liquid soap. You can apply CHG directly to the skin and wash gently with a clean washcloth.  5. Apply the CHG soap to your body only from the neck down. Do not use on open wounds or open sores. Avoid contact with your eyes, ears, mouth, and genitals (private parts). Wash face and genitals (private parts) with your normal soap.  6. Wash thoroughly, paying special attention to the area where your surgery will be performed.  7. Thoroughly rinse your body with warm water.  8. Do not shower/wash with your normal soap after using and rinsing off the CHG soap.  9. Do not use lotions, oils, etc., after showering with CHG.  10. Pat yourself dry with a clean towel.  11. Wear clean pajamas to bed the night before surgery.  12. Place clean sheets on your bed the night of your shower and do not sleep with pets.  13. Do not apply any deodorants/lotions/powders.  14. Please wear clean clothes to the hospital.  15. Remember to brush your teeth with your regular toothpaste. How to Use an Incentive Spirometer  An incentive spirometer is a tool that measures how well you are filling your lungs with each breath. Learning to take long, deep breaths using this tool can help you keep your lungs clear and active. This may help to reverse or lessen your chance of developing breathing (pulmonary) problems, especially infection. You may be asked to use a  spirometer: After a surgery. If you have a lung problem or a history of smoking. After a long period of time when you have been unable to move or be active. If the spirometer includes an indicator to show the highest number that you have reached, your health care provider or respiratory therapist will help you set a goal. Keep a log of your progress as told by your health care provider. What are the risks? Breathing too quickly may cause dizziness or cause you to pass out. Take your time so you do not get dizzy or light-headed. If you are in pain, you may need to take pain medicine before doing incentive spirometry. It is harder to take a deep breath if you are having pain. How to use your incentive spirometer  Sit up on the edge of your bed or on a chair. Hold the incentive spirometer so that it is in an upright position. Before you use the spirometer, breathe out normally. Place the mouthpiece in your mouth. Make sure your lips are closed tightly around it. Breathe in slowly and as deeply as you can through your mouth, causing the piston or the ball to rise toward the top of the chamber. Hold your breath for 3-5 seconds, or for as long as possible. If the spirometer includes a coach indicator, use this to guide you in breathing. Slow down your breathing if the indicator goes above the marked areas. Remove the mouthpiece from your mouth and breathe out normally. The piston or ball will return to the bottom of the chamber. Rest for a few seconds, then repeat the steps 10 or more times. Take your time and take a few normal breaths between deep breaths so that you do not get dizzy or light-headed. Do this every 1-2 hours when you are awake. If the spirometer includes a goal marker to show the highest number you have reached (best effort), use this as a goal to work toward during each repetition. After each set of 10 deep breaths, cough a  few times. This will help to make sure that your lungs are  clear. If you have an incision on your chest or abdomen from surgery, place a pillow or a rolled-up towel firmly against the incision when you cough. This can help to reduce pain while taking deep breaths and coughing. General tips When you are able to get out of bed: Walk around often. Continue to take deep breaths and cough in order to clear your lungs. Keep using the incentive spirometer until your health care provider says it is okay to stop using it. If you have been in the hospital, you may be told to keep using the spirometer at home. Contact a health care provider if: You are having difficulty using the spirometer. You have trouble using the spirometer as often as instructed. Your pain medicine is not giving enough relief for you to use the spirometer as told. You have a fever. Get help right away if: You develop shortness of breath. You develop a cough with bloody mucus from the lungs. You have fluid or blood coming from an incision site after you cough. Summary An incentive spirometer is a tool that can help you learn to take long, deep breaths to keep your lungs clear and active. You may be asked to use a spirometer after a surgery, if you have a lung problem or a history of smoking, or if you have been inactive for a long period of time. Use your incentive spirometer as instructed every 1-2 hours while you are awake. If you have an incision on your chest or abdomen, place a pillow or a rolled-up towel firmly against your incision when you cough. This will help to reduce pain. Get help right away if you have shortness of breath, you cough up bloody mucus, or blood comes from your incision when you cough. This information is not intended to replace advice given to you by your health care provider. Make sure you discuss any questions you have with your health care provider. Document Revised: 04/25/2019 Document Reviewed: 04/25/2019 Elsevier Patient Education  2023 [employer].

## 2024-01-19 ENCOUNTER — Encounter: Payer: Self-pay | Admitting: Urgent Care

## 2024-01-19 ENCOUNTER — Other Ambulatory Visit: Payer: Self-pay

## 2024-01-19 ENCOUNTER — Encounter: Payer: Self-pay | Admitting: Obstetrics and Gynecology

## 2024-01-19 ENCOUNTER — Encounter: Payer: Self-pay | Admitting: Obstetrics

## 2024-01-19 ENCOUNTER — Inpatient Hospital Stay
Admission: RE | Admit: 2024-01-19 | Discharge: 2024-01-21 | DRG: 788 | Disposition: A | Attending: Obstetrics and Gynecology | Admitting: Obstetrics and Gynecology

## 2024-01-19 ENCOUNTER — Inpatient Hospital Stay: Payer: Self-pay | Admitting: Urgent Care

## 2024-01-19 ENCOUNTER — Encounter: Admission: RE | Disposition: A | Payer: Self-pay | Source: Home / Self Care | Attending: Obstetrics and Gynecology

## 2024-01-19 DIAGNOSIS — O4690 Antepartum hemorrhage, unspecified, unspecified trimester: Secondary | ICD-10-CM

## 2024-01-19 DIAGNOSIS — Z98891 History of uterine scar from previous surgery: Secondary | ICD-10-CM

## 2024-01-19 DIAGNOSIS — D508 Other iron deficiency anemias: Secondary | ICD-10-CM

## 2024-01-19 DIAGNOSIS — O26893 Other specified pregnancy related conditions, third trimester: Principal | ICD-10-CM | POA: Diagnosis present

## 2024-01-19 DIAGNOSIS — Z833 Family history of diabetes mellitus: Secondary | ICD-10-CM

## 2024-01-19 DIAGNOSIS — Z01812 Encounter for preprocedural laboratory examination: Principal | ICD-10-CM

## 2024-01-19 DIAGNOSIS — O0993 Supervision of high risk pregnancy, unspecified, third trimester: Secondary | ICD-10-CM

## 2024-01-19 DIAGNOSIS — Z8249 Family history of ischemic heart disease and other diseases of the circulatory system: Secondary | ICD-10-CM

## 2024-01-19 DIAGNOSIS — Z3490 Encounter for supervision of normal pregnancy, unspecified, unspecified trimester: Secondary | ICD-10-CM

## 2024-01-19 DIAGNOSIS — Z6791 Unspecified blood type, Rh negative: Secondary | ICD-10-CM

## 2024-01-19 DIAGNOSIS — O99893 Other specified diseases and conditions complicating puerperium: Secondary | ICD-10-CM | POA: Diagnosis not present

## 2024-01-19 DIAGNOSIS — Z3A39 39 weeks gestation of pregnancy: Secondary | ICD-10-CM

## 2024-01-19 DIAGNOSIS — R030 Elevated blood-pressure reading, without diagnosis of hypertension: Secondary | ICD-10-CM | POA: Diagnosis not present

## 2024-01-19 LAB — CBC
HCT: 37.2 % (ref 36.0–46.0)
Hemoglobin: 12.4 g/dL (ref 12.0–15.0)
MCH: 28.8 pg (ref 26.0–34.0)
MCHC: 33.3 g/dL (ref 30.0–36.0)
MCV: 86.5 fL (ref 80.0–100.0)
Platelets: 324 K/uL (ref 150–400)
RBC: 4.3 MIL/uL (ref 3.87–5.11)
RDW: 18.8 % — ABNORMAL HIGH (ref 11.5–15.5)
WBC: 9.3 K/uL (ref 4.0–10.5)
nRBC: 0 % (ref 0.0–0.2)

## 2024-01-19 LAB — COMPREHENSIVE METABOLIC PANEL WITH GFR
ALT: 19 U/L (ref 0–44)
AST: 14 U/L — ABNORMAL LOW (ref 15–41)
Albumin: 3.6 g/dL (ref 3.5–5.0)
Alkaline Phosphatase: 245 U/L — ABNORMAL HIGH (ref 38–126)
Anion gap: 13 (ref 5–15)
BUN: 8 mg/dL (ref 6–20)
CO2: 20 mmol/L — ABNORMAL LOW (ref 22–32)
Calcium: 9.5 mg/dL (ref 8.9–10.3)
Chloride: 104 mmol/L (ref 98–111)
Creatinine, Ser: 0.55 mg/dL (ref 0.44–1.00)
GFR, Estimated: >60 mL/min (ref >60)
Glucose, Bld: 86 mg/dL (ref 70–99)
Potassium: 3.8 mmol/L (ref 3.5–5.1)
Sodium: 136 mmol/L (ref 135–145)
Total Bilirubin: 0.3 mg/dL (ref 0.0–1.2)
Total Protein: 6.9 g/dL (ref 6.5–8.1)

## 2024-01-19 LAB — HIV ANTIBODY (ROUTINE TESTING W REFLEX): HIV Screen 4th Generation wRfx: NONREACTIVE

## 2024-01-19 LAB — PROTEIN / CREATININE RATIO, URINE
Creatinine, Urine: 95 mg/dL
Protein Creatinine Ratio: 0.3 mg/mg{creat} — ABNORMAL HIGH (ref 0.00–0.15)
Total Protein, Urine: 28 mg/dL

## 2024-01-19 SURGERY — Surgical Case
Anesthesia: Spinal | Site: Abdomen

## 2024-01-19 MED ORDER — PHENYLEPHRINE HCL-NACL 20-0.9 MG/250ML-% IV SOLN
INTRAVENOUS | Status: DC | PRN
  Administered 2024-01-19: 40 ug/min via INTRAVENOUS

## 2024-01-19 MED ORDER — PHENYLEPHRINE 80 MCG/ML (10ML) SYRINGE FOR IV PUSH (FOR BLOOD PRESSURE SUPPORT)
PREFILLED_SYRINGE | INTRAVENOUS | Status: AC
  Filled 2024-01-19: qty 10

## 2024-01-19 MED ORDER — SENNOSIDES-DOCUSATE SODIUM 8.6-50 MG PO TABS
2.0000 | ORAL_TABLET | ORAL | Status: DC
  Administered 2024-01-19 – 2024-01-20 (×2): 2 via ORAL
  Filled 2024-01-19 (×2): qty 2

## 2024-01-19 MED ORDER — OXYTOCIN-SODIUM CHLORIDE 30-0.9 UT/500ML-% IV SOLN
INTRAVENOUS | Status: AC
  Filled 2024-01-19: qty 500

## 2024-01-19 MED ORDER — BUPIVACAINE IN DEXTROSE 0.75-8.25 % IT SOLN
INTRATHECAL | Status: DC | PRN
  Administered 2024-01-19: 1.5 mL via INTRATHECAL

## 2024-01-19 MED ORDER — SOD CITRATE-CITRIC ACID 500-334 MG/5ML PO SOLN
30.0000 mL | ORAL | Status: AC
  Administered 2024-01-19: 30 mL via ORAL

## 2024-01-19 MED ORDER — MORPHINE SULFATE (PF) 0.5 MG/ML IJ SOLN
INTRAMUSCULAR | Status: DC | PRN
  Administered 2024-01-19: .1 mg via INTRATHECAL

## 2024-01-19 MED ORDER — ONDANSETRON HCL 4 MG/2ML IJ SOLN
INTRAMUSCULAR | Status: AC
  Filled 2024-01-19: qty 2

## 2024-01-19 MED ORDER — GABAPENTIN 300 MG PO CAPS
300.0000 mg | ORAL_CAPSULE | Freq: Every day | ORAL | Status: DC
  Administered 2024-01-19 – 2024-01-20 (×2): 300 mg via ORAL
  Filled 2024-01-19 (×2): qty 1

## 2024-01-19 MED ORDER — BISACODYL 10 MG RE SUPP: 10.0000 mg | Freq: Every day | RECTAL | Status: DC | PRN

## 2024-01-19 MED ORDER — MEPERIDINE HCL 25 MG/ML IJ SOLN: 6.2500 mg | INTRAMUSCULAR | Status: DC | PRN

## 2024-01-19 MED ORDER — BUPIVACAINE HCL (PF) 0.25 % IJ SOLN
INTRAMUSCULAR | Status: AC
  Filled 2024-01-19: qty 30

## 2024-01-19 MED ORDER — ONDANSETRON HCL 4 MG/2ML IJ SOLN: 4.0000 mg | Freq: Three times a day (TID) | INTRAMUSCULAR | Status: DC | PRN

## 2024-01-19 MED ORDER — DIPHENHYDRAMINE HCL 50 MG/ML IJ SOLN: 12.5000 mg | INTRAMUSCULAR | Status: DC | PRN

## 2024-01-19 MED ORDER — FLEET ENEMA RE ENEM: 1.0000 | ENEMA | Freq: Every day | RECTAL | Status: DC | PRN

## 2024-01-19 MED ORDER — OXYTOCIN-SODIUM CHLORIDE 30-0.9 UT/500ML-% IV SOLN
INTRAVENOUS | Status: DC | PRN
  Administered 2024-01-19: 300 mL via INTRAVENOUS

## 2024-01-19 MED ORDER — MIDAZOLAM HCL 2 MG/2ML IJ SOLN
INTRAMUSCULAR | Status: AC
  Filled 2024-01-19: qty 2

## 2024-01-19 MED ORDER — LIDOCAINE HCL (PF) 2 % IJ SOLN
INTRAMUSCULAR | Status: DC | PRN
  Administered 2024-01-19: 1 mL via INTRADERMAL

## 2024-01-19 MED ORDER — SCOPOLAMINE 1 MG/3DAYS TD PT72
1.0000 | MEDICATED_PATCH | Freq: Once | TRANSDERMAL | Status: DC
  Administered 2024-01-19: 1 mg via TRANSDERMAL
  Filled 2024-01-19: qty 1

## 2024-01-19 MED ORDER — PRENATAL MULTIVITAMIN CH
1.0000 | ORAL_TABLET | Freq: Every day | ORAL | Status: DC
  Administered 2024-01-20 – 2024-01-21 (×2): 1 via ORAL
  Filled 2024-01-19 (×2): qty 1

## 2024-01-19 MED ORDER — NALOXONE HCL 4 MG/10ML IJ SOLN: 1.0000 ug/kg/h | INTRAVENOUS | Status: DC | PRN

## 2024-01-19 MED ORDER — COCONUT OIL OIL
1.0000 | TOPICAL_OIL | Status: DC | PRN
  Filled 2024-01-19: qty 7.5

## 2024-01-19 MED ORDER — FERROUS SULFATE 325 (65 FE) MG PO TABS
325.0000 mg | ORAL_TABLET | Freq: Two times a day (BID) | ORAL | Status: DC
  Administered 2024-01-19 – 2024-01-21 (×4): 325 mg via ORAL
  Filled 2024-01-19 (×4): qty 1

## 2024-01-19 MED ORDER — IBUPROFEN 600 MG PO TABS: 600.0000 mg | ORAL_TABLET | Freq: Four times a day (QID) | ORAL | Status: DC

## 2024-01-19 MED ORDER — LACTATED RINGERS IV SOLN: INTRAVENOUS | Status: DC

## 2024-01-19 MED ORDER — DEXAMETHASONE SOD PHOSPHATE PF 10 MG/ML IJ SOLN
INTRAMUSCULAR | Status: DC | PRN
  Administered 2024-01-19: 5 mg via INTRAVENOUS

## 2024-01-19 MED ORDER — ACETAMINOPHEN 500 MG PO TABS
1000.0000 mg | ORAL_TABLET | Freq: Four times a day (QID) | ORAL | Status: DC
  Administered 2024-01-19: 1000 mg via ORAL
  Filled 2024-01-19: qty 2

## 2024-01-19 MED ORDER — MENTHOL 3 MG MT LOZG: 1.0000 | LOZENGE | OROMUCOSAL | Status: DC | PRN

## 2024-01-19 MED ORDER — DEXMEDETOMIDINE HCL IN NACL 80 MCG/20ML IV SOLN
INTRAVENOUS | Status: DC | PRN
  Administered 2024-01-19: 4 ug via INTRAVENOUS
  Administered 2024-01-19: 8 ug via INTRAVENOUS

## 2024-01-19 MED ORDER — SIMETHICONE 80 MG PO CHEW
80.0000 mg | CHEWABLE_TABLET | Freq: Three times a day (TID) | ORAL | Status: DC
  Administered 2024-01-19 – 2024-01-21 (×6): 80 mg via ORAL
  Filled 2024-01-19 (×6): qty 1

## 2024-01-19 MED ORDER — ACETAMINOPHEN 500 MG PO TABS
1000.0000 mg | ORAL_TABLET | Freq: Four times a day (QID) | ORAL | Status: DC
  Administered 2024-01-19 – 2024-01-20 (×2): 1000 mg via ORAL
  Filled 2024-01-19 (×2): qty 2

## 2024-01-19 MED ORDER — BUPIVACAINE HCL (PF) 0.25 % IJ SOLN
INTRAMUSCULAR | Status: DC | PRN
  Administered 2024-01-19: 60 mL

## 2024-01-19 MED ORDER — MORPHINE SULFATE (PF) 0.5 MG/ML IJ SOLN
INTRAMUSCULAR | Status: DC | PRN
  Administered 2024-01-19: 2.5 mg via INTRAVENOUS

## 2024-01-19 MED ORDER — MIDAZOLAM HCL 5 MG/5ML IJ SOLN
INTRAMUSCULAR | Status: DC | PRN
  Administered 2024-01-19: 2 mg via INTRAVENOUS

## 2024-01-19 MED ORDER — DIPHENHYDRAMINE HCL 25 MG PO CAPS: 25.0000 mg | ORAL_CAPSULE | Freq: Four times a day (QID) | ORAL | Status: DC | PRN

## 2024-01-19 MED ORDER — METHYLERGONOVINE MALEATE 0.2 MG/ML IJ SOLN
INTRAMUSCULAR | Status: DC
  Filled 2024-01-19: qty 1

## 2024-01-19 MED ORDER — TRANEXAMIC ACID-NACL 1000-0.7 MG/100ML-% IV SOLN
INTRAVENOUS | Status: DC | PRN
  Administered 2024-01-19: 1000 mg via INTRAVENOUS

## 2024-01-19 MED ORDER — DIBUCAINE (PERIANAL) 1 % EX OINT: 1.0000 | TOPICAL_OINTMENT | CUTANEOUS | Status: DC | PRN

## 2024-01-19 MED ORDER — ONDANSETRON HCL 4 MG/2ML IJ SOLN
INTRAMUSCULAR | Status: DC | PRN
  Administered 2024-01-19: 4 mg via INTRAVENOUS

## 2024-01-19 MED ORDER — MEASLES, MUMPS & RUBELLA VAC ~~LOC~~ SUSR: 0.5000 mL | Freq: Once | SUBCUTANEOUS | Status: DC

## 2024-01-19 MED ORDER — CARBOPROST TROMETHAMINE 250 MCG/ML IM SOLN
INTRAMUSCULAR | Status: DC
  Filled 2024-01-19: qty 1

## 2024-01-19 MED ORDER — KETOROLAC TROMETHAMINE 30 MG/ML IJ SOLN
30.0000 mg | Freq: Four times a day (QID) | INTRAMUSCULAR | Status: AC | PRN
  Filled 2024-01-19: qty 1

## 2024-01-19 MED ORDER — KETAMINE HCL 50 MG/5ML IJ SOSY
PREFILLED_SYRINGE | INTRAMUSCULAR | Status: AC
  Filled 2024-01-19: qty 5

## 2024-01-19 MED ORDER — DIPHENHYDRAMINE HCL 25 MG PO CAPS: 25.0000 mg | ORAL_CAPSULE | ORAL | Status: DC | PRN

## 2024-01-19 MED ORDER — PHENYLEPHRINE 80 MCG/ML (10ML) SYRINGE FOR IV PUSH (FOR BLOOD PRESSURE SUPPORT)
PREFILLED_SYRINGE | INTRAVENOUS | Status: DC | PRN
  Administered 2024-01-19: 160 ug via INTRAVENOUS
  Administered 2024-01-19: 80 ug via INTRAVENOUS

## 2024-01-19 MED ORDER — KETOROLAC TROMETHAMINE 30 MG/ML IJ SOLN
30.0000 mg | Freq: Four times a day (QID) | INTRAMUSCULAR | Status: AC
  Administered 2024-01-19 – 2024-01-20 (×3): 30 mg via INTRAVENOUS
  Filled 2024-01-19 (×3): qty 1

## 2024-01-19 MED ORDER — MORPHINE SULFATE (PF) 0.5 MG/ML IJ SOLN
INTRAMUSCULAR | Status: AC
  Filled 2024-01-19: qty 10

## 2024-01-19 MED ORDER — FENTANYL CITRATE (PF) 100 MCG/2ML IJ SOLN
INTRAMUSCULAR | Status: DC | PRN
  Administered 2024-01-19: 10 ug via INTRATHECAL

## 2024-01-19 MED ORDER — OXYCODONE HCL 5 MG PO TABS
5.0000 mg | ORAL_TABLET | Freq: Four times a day (QID) | ORAL | Status: DC | PRN
  Administered 2024-01-20: 5 mg via ORAL
  Filled 2024-01-19: qty 1

## 2024-01-19 MED ORDER — PHENYLEPHRINE HCL-NACL 20-0.9 MG/250ML-% IV SOLN
INTRAVENOUS | Status: AC
  Filled 2024-01-19: qty 250

## 2024-01-19 MED ORDER — SOD CITRATE-CITRIC ACID 500-334 MG/5ML PO SOLN
ORAL | Status: AC
  Filled 2024-01-19: qty 15

## 2024-01-19 MED ORDER — FENTANYL CITRATE (PF) 100 MCG/2ML IJ SOLN
INTRAMUSCULAR | Status: DC | PRN
  Administered 2024-01-19: 40 ug via INTRAVENOUS
  Administered 2024-01-19: 50 ug via INTRAVENOUS

## 2024-01-19 MED ORDER — SODIUM CHLORIDE 0.9% FLUSH
3.0000 mL | INTRAVENOUS | Status: DC | PRN
  Administered 2024-01-20: 3 mL via INTRAVENOUS

## 2024-01-19 MED ORDER — FENTANYL CITRATE (PF) 100 MCG/2ML IJ SOLN
INTRAMUSCULAR | Status: AC
  Filled 2024-01-19: qty 2

## 2024-01-19 MED ORDER — SIMETHICONE 80 MG PO CHEW: 80.0000 mg | CHEWABLE_TABLET | ORAL | Status: DC | PRN

## 2024-01-19 MED ORDER — KETOROLAC TROMETHAMINE 30 MG/ML IJ SOLN: 30.0000 mg | Freq: Four times a day (QID) | INTRAMUSCULAR | Status: AC | PRN

## 2024-01-19 MED ORDER — WITCH HAZEL-GLYCERIN EX PADS: 1.0000 | MEDICATED_PAD | CUTANEOUS | Status: DC | PRN

## 2024-01-19 MED ORDER — NALOXONE HCL 0.4 MG/ML IJ SOLN: 0.4000 mg | INTRAMUSCULAR | Status: DC | PRN

## 2024-01-19 MED ORDER — KETAMINE HCL 50 MG/5ML IJ SOSY
PREFILLED_SYRINGE | INTRAMUSCULAR | Status: DC | PRN
  Administered 2024-01-19: 10 mg via INTRAVENOUS
  Administered 2024-01-19: 20 mg via INTRAVENOUS

## 2024-01-19 MED ORDER — EPHEDRINE SULFATE (PRESSORS) 25 MG/5ML IV SOSY
PREFILLED_SYRINGE | INTRAVENOUS | Status: DC | PRN
  Administered 2024-01-19: 10 mg via INTRAVENOUS

## 2024-01-19 MED ORDER — OXYTOCIN-SODIUM CHLORIDE 30-0.9 UT/500ML-% IV SOLN
2.5000 [IU]/h | INTRAVENOUS | Status: AC
  Administered 2024-01-19: 2.5 [IU]/h via INTRAVENOUS

## 2024-01-19 SURGICAL SUPPLY — 33 items
BARRIER ADHS 3X4 INTERCEED (GAUZE/BANDAGES/DRESSINGS) IMPLANT
BENZOIN TINCTURE PRP APPL 2/3 (GAUZE/BANDAGES/DRESSINGS) IMPLANT
CHLORAPREP W/TINT 26 (MISCELLANEOUS) ×1 IMPLANT
DRSG ADAPTIC 3X8 NADH LF (GAUZE/BANDAGES/DRESSINGS) IMPLANT
DRSG OPSITE POSTOP 4X10 (GAUZE/BANDAGES/DRESSINGS) IMPLANT
DRSG TELFA 3X8 NADH STRL (GAUZE/BANDAGES/DRESSINGS) ×1 IMPLANT
ELECTRODE REM PT RTRN 9FT ADLT (ELECTROSURGICAL) ×1 IMPLANT
GAUZE SPONGE 4X4 12PLY STRL (GAUZE/BANDAGES/DRESSINGS) ×1 IMPLANT
GLOVE BIOGEL M 6.5 STRL (GLOVE) IMPLANT
GLOVE BIOGEL M 7.0 STRL (GLOVE) IMPLANT
GLOVE SURG SYN 6.5 PF PI BL (GLOVE) IMPLANT
GOWN STRL REUS W/ TWL LRG LVL3 (GOWN DISPOSABLE) ×3 IMPLANT
MANIFOLD NEPTUNE II (INSTRUMENTS) ×1 IMPLANT
MAT PREVALON FULL STRYKER (MISCELLANEOUS) ×1 IMPLANT
NDL HYPO 25GX1X1/2 BEV (NEEDLE) ×1 IMPLANT
NDL SPNL 22GX1.5 QUINCKE BK (NEEDLE) IMPLANT
PACK C SECTION AR (MISCELLANEOUS) ×1 IMPLANT
PAD OB MATERNITY 11 LF (PERSONAL CARE ITEMS) ×1 IMPLANT
PAD PREP OB/GYN DISP 24X41 (PERSONAL CARE ITEMS) ×1 IMPLANT
RETRACTOR WND ALEXIS-O 25 LRG (MISCELLANEOUS) IMPLANT
SCRUB CHG 4% DYNA-HEX 4OZ (MISCELLANEOUS) ×1 IMPLANT
SOLN 0.9% NACL POUR BTL 1000ML (IV SOLUTION) ×1 IMPLANT
SOLN STERILE WATER 500 ML (IV SOLUTION) ×1 IMPLANT
STAPLER INSORB 30 2030 C-SECTI (MISCELLANEOUS) IMPLANT
SUT MAXON 0 T 12 3 (SUTURE) IMPLANT
SUT MAXON ABS #0 GS21 30IN (SUTURE) IMPLANT
SUT PROLENE 0 CT 1 CR/8 (SUTURE) IMPLANT
SUT VIC AB 0 CT1 36 (SUTURE) ×2 IMPLANT
SUT VIC AB 0 CTX36XBRD ANBCTRL (SUTURE) ×2 IMPLANT
SUT VIC AB 2-0 SH 27XBRD (SUTURE) ×2 IMPLANT
SUTURE MNCRL 4-0 27XMF (SUTURE) ×1 IMPLANT
SYR 30ML LL (SYRINGE) ×2 IMPLANT
TRAP FLUID SMOKE EVACUATOR (MISCELLANEOUS) ×1 IMPLANT

## 2024-01-19 NOTE — H&P (Signed)
 Obstetric Preoperative History and Physical  Stacy Palmer is a 33 y.o. G2P0010 with IUP at [redacted]w[redacted]d presenting for scheduled cesarean section.  No acute concerns.   Prenatal Course Source of Care: [facility] Pregnancy complications or risks: Patient Active Problem List   Diagnosis Date Noted   Vaginal bleeding during pregnancy 01/15/2024   Iron  deficiency anemia, unspecified 12/08/2023   Anemia during pregnancy in third trimester 12/01/2023   Low lying placenta, antepartum 09/02/2023   Low TSH level 08/05/2023   Marijuana use during pregnancy 08/05/2023   Obesity affecting pregnancy in second trimester 08/05/2023   Rh negative status during pregnancy in second trimester 08/05/2023   Supervision of high risk pregnancy in third trimester 06/15/2023   She plans to breastfeed She desires IUD for postpartum contraception.   Prenatal labs and studies: ABO, Rh: --/--/O NEG (12/01 1053) Antibody: NEG (12/01 1053) Rubella:  immune RPR:   neg HBsAg:   neg HIV:   neg GBS: neg 1 hr Glucola  109 Genetic screening normal Anatomy US  normal   Past Medical History:  Diagnosis Date   Menorrhagia 2013    Past Surgical History:  Procedure Laterality Date   denies     NO PAST SURGERIES      OB History  Gravida Para Term Preterm AB Living  2 0 0 0 1 0  SAB IAB Ectopic Multiple Live Births  1 0 0 0 0    # Outcome Date GA Lbr Len/2nd Weight Sex Type Anes PTL Lv  2 Current           1 SAB 2023            Social History   Socioeconomic History   Marital status: Significant Other    Spouse name: Not on file   Number of children: Not on file   Years of education: Not on file   Highest education level: Not on file  Occupational History   Not on file  Tobacco Use   Smoking status: Never   Smokeless tobacco: Never  Vaping Use   Vaping status: Never Used  Substance and Sexual Activity   Alcohol use: Not Currently    Comment: last use 05/24/2023 tequila   Drug use: Not Currently     Types: Marijuana    Comment: last mj use 05/24/23/counseled   Sexual activity: Yes    Partners: Female    Birth control/protection: None, I.U.D.    Comment: last bcm - depo 2019  Other Topics Concern   Not on file  Social History Narrative   Live in home with significant other   Social Drivers of Health   Financial Resource Strain: Low Risk  (07/06/2023)   Received from [facility]   Overall Financial Resource Strain (CARDIA)    Difficulty of Paying Living Expenses: Not very hard  Food Insecurity: No Food Insecurity (01/19/2024)   Hunger Vital Sign    Worried About Running Out of Food in the Last Year: Never true    Ran Out of Food in the Last Year: Never true  Transportation Needs: No Transportation Needs (01/19/2024)   PRAPARE - Administrator, Civil Service (Medical): No    Lack of Transportation (Non-Medical): No  Physical Activity: Not on file  Stress: Not on file  Social Connections: Not on file    Family History  Problem Relation Age of Onset   Hypertension Mother    Migraines Mother    Diabetes Mother  Hypertension Maternal Grandmother    Hypertension Paternal Grandmother     Medications Prior to Admission  Medication Sig Dispense Refill Last Dose/Taking   Prenatal Vit-Fe Fumarate-FA (PRENATAL MULTIVITAMIN) TABS tablet Take 2 tablets by mouth daily at 12 noon.   Past Week   ibuprofen  (ADVIL ) 800 MG tablet Take 1 tablet (800 mg total) by mouth every 8 (eight) hours as needed for cramping. (Patient not taking: Reported on 10/27/2022) 30 tablet 0    ondansetron  (ZOFRAN  ODT) 4 MG disintegrating tablet Take 1 tablet (4 mg total) by mouth every 6 (six) hours as needed for nausea. (Patient not taking: Reported on 10/27/2022) 120 tablet 3    promethazine  (PHENERGAN ) 25 MG tablet Take 1 tablet (25 mg total) by mouth every 6 (six) hours as needed for nausea or vomiting. (Patient not taking: Reported on 10/27/2022) 120 tablet 3     No Known  Allergies  Review of Systems: Negative except for what is mentioned in HPI.  Physical Exam: BP (!) 145/96   Pulse 97   Temp 98.1 F (36.7 C) (Oral)   Resp 18   Ht 5' 2 (1.575 m)   Wt 83.5 kg   LMP 04/18/2023   BMI 33.65 kg/m  FHR by Doppler: 140 bpm CONSTITUTIONAL: Well-developed, well-nourished female in no acute distress.  NECK: Normal range of motion, supple, no masses SKIN: Skin is warm and dry. No rash noted. Not diaphoretic. No erythema. No pallor.  NEUROLGIC: Alert and oriented to person, place, and time. Normal reflexes, muscle tone coordination.  PSYCHIATRIC: Normal mood and affect. Normal behavior. Normal judgment and thought content. CARDIOVASCULAR: Normal heart rate noted, regular rhythm RESPIRATORY: Effort and breath sounds normal, no problems with respiration noted ABDOMEN: Soft, nontender, nondistended, gravid. Well-healed Pfannenstiel incision. PELVIC: Deferred MUSCULOSKELETAL: Normal range of motion. No edema and no tenderness.    Pertinent Labs/Studies:   Results for orders placed or performed during the hospital encounter of 01/19/24 (from the past 72 hours)  CBC     Status: Abnormal   Collection Time: 01/19/24  9:47 AM  Result Value Ref Range   WBC 9.3 4.0 - 10.5 K/uL   RBC 4.30 3.87 - 5.11 MIL/uL   Hemoglobin 12.4 12.0 - 15.0 g/dL   HCT 62.7 63.9 - 53.9 %   MCV 86.5 80.0 - 100.0 fL   MCH 28.8 26.0 - 34.0 pg   MCHC 33.3 30.0 - 36.0 g/dL   RDW 81.1 (H) 88.4 - 84.4 %   Platelets 324 150 - 400 K/uL   nRBC 0.0 0.0 - 0.2 %    Comment: Performed at [facility], [address] [place], [place] [address]    Assessment and Plan :Stacy Palmer is a 33 y.o. G2P0010 at [redacted]w[redacted]d being admitted for scheduled cesarean section. The risks of cesarean section discussed with the patient included but were not limited to: bleeding which may require transfusion or reoperation; infection which may require antibiotics; injury to bowel, bladder, ureters or other  surrounding organs; injury to the fetus; need for additional procedures including hysterectomy in the event of a life-threatening hemorrhage; placental abnormalities wth subsequent pregnancies, incisional problems, thromboembolic phenomenon and other postoperative/anesthesia complications. The patient concurred with the proposed plan, giving informed written consent for the procedure. Patient has been NPO since last night she will remain NPO for procedure. Anesthesia and OR aware. Preoperative prophylactic antibiotics and SCDs ordered on call to the OR. To OR when ready.   Flu in season -Given 12/17/23 DC  Tdap at 27-36 weeks -received 11/17/23  COVID- has not received,declined RSV in season at 32-36 weeks- Given 12/01/23 Contraception Plan: Mirena IUD Feeding Plan: wants to pump    Heather Penton, MD, MPH, FACOG

## 2024-01-19 NOTE — Transfer of Care (Cosign Needed)
 Immediate Anesthesia Transfer of Care Note  Patient: Stacy Palmer  Procedure(s) Performed: CESAREAN DELIVERY (Abdomen)  Patient Location: PACU and OB High Risk  Anesthesia Type:Spinal  Level of Consciousness: awake, alert , oriented, and patient cooperative  Airway & Oxygen Therapy: Patient Spontanous Breathing  Post-op Assessment: Report given to RN, Post -op Vital signs reviewed and stable, and Patient moving all extremities X 4  Post vital signs: Reviewed and stable  Last Vitals:  Vitals Value Taken Time  BP    Temp    Pulse    Resp    SpO2      Last Pain:  Vitals:   01/19/24 1100  TempSrc:   PainSc: 0-No pain         Complications: No notable events documented.

## 2024-01-19 NOTE — Anesthesia Preprocedure Evaluation (Addendum)
 Anesthesia Evaluation  Patient identified by MRN, date of birth, ID band Patient awake    Reviewed: Allergy & Precautions, H&P , NPO status , Patient's Chart, lab work & pertinent test results  Airway Mallampati: II  TM Distance: >3 FB Neck ROM: full    Dental no notable dental hx.    Pulmonary neg pulmonary ROS   Pulmonary exam normal        Cardiovascular Exercise Tolerance: Good negative cardio ROS Normal cardiovascular exam     Neuro/Psych    GI/Hepatic negative GI ROS,,,  Endo/Other    Renal/GU   negative genitourinary   Musculoskeletal   Abdominal Normal abdominal exam  (+)   Peds  Hematology  (+) Blood dyscrasia, anemia   Anesthesia Other Findings Past Medical History: 2013: Menorrhagia  Past Surgical History: No date: denies No date: NO PAST SURGERIES  BMI    Body Mass Index: 33.65 kg/m      Reproductive/Obstetrics (+) Pregnancy                              Anesthesia Physical Anesthesia Plan  ASA: 2  Anesthesia Plan: Spinal   Post-op Pain Management:    Induction:   PONV Risk Score and Plan: Ondansetron   Airway Management Planned:   Additional Equipment:   Intra-op Plan:   Post-operative Plan:   Informed Consent: I have reviewed the patients History and Physical, chart, labs and discussed the procedure including the risks, benefits and alternatives for the proposed anesthesia with the patient or authorized representative who has indicated his/her understanding and acceptance.     Dental Advisory Given  Plan Discussed with: Anesthesiologist and CRNA  Anesthesia Plan Comments:          Anesthesia Quick Evaluation

## 2024-01-19 NOTE — Lactation Note (Signed)
 This note was copied from a baby's chart. Lactation Consultation Note  Patient Name: Stacy Palmer Unijb'd Date: 01/19/2024 Age:34 hours Reason for consult: Initial assessment;Primapara;Term;Breastfeeding assistance   Maternal Data Initial assessment w/ a 4hr old baby Stacy Stacy Palmer and first time mom.  This was a c-section delivery.  Patient w/ a hx of iron  deficiency, and MJ used during pregnancy. Mom verbalized that her feeding goal is to primarily pump and formula feed, she is not to fond of putting baby to the breast but will if she needs too.  Mom stated that she has a Mom Cozy pump at home.  Feeding Mother's Current Feeding Choice: Breast Milk and Formula (Mom primarily wants to pump/formula feed.  But still opened to breastfeeding.)  Patient expressed to [facility] during this visit she would like a bottle of formula to feed infant and maybe she would start pumping later on.  [facility] taught both parents how to pace bottle feed.  Infant took in 4ml of formula.   Interventions Interventions: Breast feeding basics reviewed;Education;Pace feeding  LC provided education on the following;  milk production expectations, hunger cues, day 1/2 wet/dirty diapers, benefits of STS and arousing infant for a feeding.  Lactation informed patient of feeding infant at least 8 or more times w/in a 24hr period but not exceeding 3hrs. Patient verbalized understanding.   Discharge Pump: Personal;Hands [name] (Mom Cozy)  Consult Status Consult Status: Follow-up Follow-up type: In-patient    Stacy Palmer 01/19/2024, 5:47 PM

## 2024-01-19 NOTE — Op Note (Signed)
 Cesarean Section Procedure Note  Date of procedure: 01/19/2024   Pre-operative Diagnosis: Intrauterine pregnancy at [redacted]w[redacted]d;  - elective cesarean  Post-operative Diagnosis: same, delivered.  Procedure: Primary Low Transverse Cesarean Section through Pfannenstiel incision  Surgeon: Heather Penton, MD  Assistant(s):  Bobbette Brunswick, CNM   An experienced assistant was required given the standard of surgical care given the complexity of the case.  This assistant was needed for exposure, dissection, suctioning, retraction, instrument exchange,  CNM assisting with delivery with administration of fundal pressure, and for overall help during the procedure.   Anesthesia: Local anesthesia 0.25.% bupivacaine and Spinal anesthesia  Anesthesiologist: No responsible provider has been recorded for the case. Anesthesiologist: Myra Lynwood MATSU, MD CRNA: Jaylene Nest, CRNA; Gillermo Spruce I, CRNA Student Nurse Anesthetist: Carley Pac, RN  Estimated Blood Loss:  690         Drains: Foley         Total IV Fluids:  Urine Output: 40ml         Specimens: cord blood for rh neg         Complications:  discomfort with spinal and received additional iv meds, but did not need general anesthesia         Disposition: PACU - hemodynamically stable.         Condition: stable  Findings:  A female infant Winter in cephalic presentation. Amniotic fluid - Meconium  Birth weight 7400 g.   APGAR (1 MIN): 9  APGAR (5 MINS): 9  APGAR (10 MINS):     Intact placenta with a three-vessel cord.  Grossly normal uterus, tubes and ovaries bilaterally. No intraabdominal adhesions were noted.  Bleeding was heavy until hysterotomy closed and atony present; pt received TXA   Procedure Details  The patient was taken to Operating Room, identified as the correct patient and the procedure verified as C-Section Delivery. A formal Time Out was held with all team members present and in  agreement.  After induction of anesthesia, the patient was draped and prepped in the usual sterile manner. A Pfannenstiel skin incision was made and carried down through the subcutaneous tissue to the fascia. Fascial incision was made and extended transversely with the Mayo scissors. The fascia was separated from the underlying rectus tissue superiorly and inferiorly. The peritoneum was identified and entered bluntly. Peritoneal incision was extended longitudinally. The utero-vesical peritoneal reflection was incised transversely and a bladder flap was created digitally.   A low transverse hysterotomy was made. The fetus was delivered atraumatically. The umbilical cord was clamped x2 and cut and the infant was handed to the awaiting pediatricians. The placenta was removed intact and appeared normal, intact, and with a 3-vessel cord.   The uterus was exteriorized and cleared of all clot and debris. The hysterotomy was closed with running sutures of 0-Vicryl. A second imbricating layer was placed with the same suture. Excellent hemostasis was observed. The peritoneal cavity was cleared of all clots and debris. The uterus was returned to the abdomen.   Gutters and pelvis were evaluated and excellent hemostasis was noted. The fascia was then reapproximated with running sutures of 0 Maxon.  The subcutaneous tissue was reapproximated with running sutures of 0 Vicryl. The skin was reapproximated with Ensorb absorbable staples. 60 of 0.25% bupivicaine was placed in the fascial and skin lines.  Instrument, sponge, and needle counts were correct prior to the abdominal closure and at the conclusion of the case.   The patient tolerated the procedure well and was  transferred to the recovery room in stable condition.   Heather Penton, MD 01/19/2024

## 2024-01-20 ENCOUNTER — Encounter: Payer: Self-pay | Admitting: Obstetrics

## 2024-01-20 ENCOUNTER — Other Ambulatory Visit (HOSPITAL_COMMUNITY): Payer: Self-pay

## 2024-01-20 ENCOUNTER — Encounter: Payer: Self-pay | Admitting: Obstetrics and Gynecology

## 2024-01-20 LAB — URINE DRUG SCREEN
Amphetamines: NEGATIVE
Barbiturates: NEGATIVE
Benzodiazepines: POSITIVE — AB
Cocaine: NEGATIVE
Fentanyl: POSITIVE — AB
Methadone Scn, Ur: NEGATIVE
Opiates: NEGATIVE
Tetrahydrocannabinol: POSITIVE — AB

## 2024-01-20 LAB — CBC
HCT: 32.4 % — ABNORMAL LOW (ref 36.0–46.0)
Hemoglobin: 10.5 g/dL — ABNORMAL LOW (ref 12.0–15.0)
MCH: 28.5 pg (ref 26.0–34.0)
MCHC: 32.4 g/dL (ref 30.0–36.0)
MCV: 87.8 fL (ref 80.0–100.0)
Platelets: 325 K/uL (ref 150–400)
RBC: 3.69 MIL/uL — ABNORMAL LOW (ref 3.87–5.11)
RDW: 18.6 % — ABNORMAL HIGH (ref 11.5–15.5)
WBC: 16.5 K/uL — ABNORMAL HIGH (ref 4.0–10.5)
nRBC: 0 % (ref 0.0–0.2)

## 2024-01-20 LAB — SYPHILIS: RPR W/REFLEX TO RPR TITER AND TREPONEMAL ANTIBODIES, TRADITIONAL SCREENING AND DIAGNOSIS ALGORITHM: RPR Ser Ql: NONREACTIVE

## 2024-01-20 LAB — FETAL SCREEN: Fetal Screen: NEGATIVE

## 2024-01-20 LAB — KLEIHAUER-BETKE STAIN
# Vials RhIg: 1
Fetal Cells %: 0 %
Quantitation Fetal Hemoglobin: 0 mL

## 2024-01-20 MED ORDER — OXYCODONE HCL 5 MG PO TABS
5.0000 mg | ORAL_TABLET | ORAL | Status: DC | PRN
  Administered 2024-01-20 – 2024-01-21 (×3): 5 mg via ORAL
  Filled 2024-01-20 (×3): qty 1

## 2024-01-20 MED ORDER — NIFEDIPINE ER OSMOTIC RELEASE 30 MG PO TB24
30.0000 mg | ORAL_TABLET | Freq: Every day | ORAL | Status: DC
  Administered 2024-01-20 – 2024-01-21 (×2): 30 mg via ORAL
  Filled 2024-01-20 (×2): qty 1

## 2024-01-20 MED ORDER — ACETAMINOPHEN 500 MG PO TABS
1000.0000 mg | ORAL_TABLET | Freq: Four times a day (QID) | ORAL | Status: DC
  Administered 2024-01-20 – 2024-01-21 (×5): 1000 mg via ORAL
  Filled 2024-01-20 (×5): qty 2

## 2024-01-20 MED ORDER — IBUPROFEN 600 MG PO TABS
600.0000 mg | ORAL_TABLET | Freq: Four times a day (QID) | ORAL | Status: DC
  Administered 2024-01-20 – 2024-01-21 (×3): 600 mg via ORAL
  Filled 2024-01-20 (×3): qty 1

## 2024-01-20 NOTE — Lactation Note (Signed)
 This note was copied from a baby's chart. Lactation Consultation Note  Patient Name: Stacy Palmer Date: 01/20/2024 Age:34 hours Reason for consult: Follow-up assessment;Primapara;Term (pump initiation)   Maternal Data This is mom's 1st baby, C/S. Mom with history of obesity, anemia, and MJ use(mom and baby positive).  On follow-up mom reports she would like to start pumping in order to be able to give breastmilk feeds by bottle. Mom is also planning at times to breastfeed. She will also use formula as needed. When [facility] entered the room to assist mother with breastpumping initiation per her request mom was completing a breastfeeding.  Has patient been taught Hand Expression?: Yes Does the patient have breastfeeding experience prior to this delivery?: No  Feeding Mother's Current Feeding Choice: Breast Milk and Formula   Lactation Tools Discussed/Used Tools: Pump Breast pump type: Double-Electric Breast Pump Pump Education: Setup, frequency, and cleaning;Milk Storage Reason for Pumping: Maternal request as mom plans to provide some breastmilk bottle  feedings. Pumping frequency: If mom provides a formula bottle in place of a breastfeeding mom will pump and/or if at a feeding mom decides she wants to give pumped milk via slow flow bottle. Pumped volume: 12 mL  Interventions Interventions: DEBP;Education;Breast feeding basics reviewed;Coconut oil  Discharge Pump: Personal;Hands Free (Mom has Mom Cozies)  Consult Status Consult Status: Follow-up Date: 01/21/24 Follow-up type: In-patient  Update provided to care nurse.  Stacy Palmer 01/20/2024, 10:43 PM

## 2024-01-20 NOTE — Anesthesia Postprocedure Evaluation (Signed)
 Anesthesia Post Note  Patient: Stacy Palmer  Procedure(s) Performed: CESAREAN DELIVERY (Abdomen)  Anesthesia Type: Spinal Anesthetic complications: no   No notable events documented.   Last Vitals:  Vitals:   01/20/24 0322 01/20/24 0835  BP: (!) 134/93 117/89  Pulse: (!) 101 90  Resp: 18 20  Temp: 36.8 C 36.8 C  SpO2: 99% 100%    Last Pain:  Vitals:   01/20/24 0902  TempSrc:   PainSc: 4                  Lynwood [name]

## 2024-01-20 NOTE — Clinical Social Work Maternal (Addendum)
 CLINICAL SOCIAL WORK MATERNAL/CHILD NOTE  Patient Details  Name: Stacy Palmer MRN: [id number] Date of Birth: 12-25-1990  Date:  01/20/2024  Clinical Social Worker Initiating Note:  Corrie Ruts Date/Time: Initiated:  01/20/24/1100     Child's Name:  Stacy Palmer   Biological Parents:  Mother, Father   Need for Interpreter:  None   Reason for Referral:  Current Substance Use/Substance Use During Pregnancy     Address:  [address] [place] [address] [address]    Phone number:  [phone] (home)     Additional phone number:   Household Members/Support Persons (HM/SP):   Household Member/Support Person 1   HM/SP Name Relationship DOB or Age  HM/SP -1 Stacy Palmer FOB    HM/SP -2        HM/SP -3        HM/SP -4        HM/SP -5        HM/SP -6        HM/SP -7        HM/SP -8          Natural Supports (not living in the home):  Extended Family, Parent, Immediate Family   Professional Supports:     Employment: Full-time   Type of 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 There was a consult put in for Positive Toxicology screen for [facility]. I was able to speak with the patient, FOB was sleeping and Grandmother of the patient stepped out the room for half of the consult. I introduced my self, my role, and reason for consult.  The patient reports  that she felt wacky after delivery. The patient confirmed that her address is [address] [place] [address] and her telephone number is [phone]. The patient reports that she lives in the home with the FOB. The patient reports that she has support outside of the home. The patient reports that her highest level of education is some college. The patient reports that she works at [employer].  The patient reports that she receives [facility]. The patient reports that she has no mental health history. The patient reports that she is not active in counseling or therapy. The patient reports that she has a PCP. The patient reports that she will receive a list from the nurse to pick out pediatrician for baby. The patient denies in any past or current SI/HI/DV.   I reviewed information on post partum depression, sudden infant death syndrome, car seat safety, safe sleep environment, mental health resources, post partum community resources. The patient verbalized understanding.   I informed the patient of the law and hospital policy  on drug exposure with children. I informed the patient that a CPS report has to be made. The patient verbalized understanding.   The patient reports that she has crib, pack and play, diapers, clothes, and car seat for the baby. The patient reports that she will breast feed and has a breast pump. The patient reports that the FOB will help during D/C.   The CPS reports has been made with freda drewry with [employer].     CSW Plan/Description:  Perinatal Mood and Anxiety Disorder (PMADs) Education, Sudden Infant Death Syndrome (SIDS) Education, Hospital Drug Screen Policy Information, Child Protective Service Report  , CSW Awaiting CPS Disposition Plan    Corrie JINNY Ruts, LCSW 01/20/2024, 3:09 PM

## 2024-01-20 NOTE — Progress Notes (Signed)
 Post Partum Day 1  Subjective: Doing well, no concerns. Ambulating without difficulty, pain managed with PO meds, tolerating regular diet, and voiding without difficulty.   No fever/chills, chest pain, shortness of breath, nausea/vomiting, or leg pain. No nipple or breast pain. No headache, visual changes, or RUQ/epigastric pain.  Objective: BP (!) 133/98   Pulse 90   Temp 98.5 F (36.9 C) (Oral)   Resp 16   Ht 5' 2 (1.575 m)   Wt 83.5 kg   LMP 04/18/2023   SpO2 100%   Breastfeeding Unknown   BMI 33.65 kg/m    Physical Exam:  General: alert and cooperative Breasts: soft/nontender CV: RRR Pulm: nl effort Abdomen: soft, non-tender Uterine Fundus: firm Incision: healing well Perineum: intact Lochia: appropriate DVT Evaluation: No evidence of DVT seen on physical exam. Edinburgh:      No data to display           Recent Labs    01/19/24 0947 01/20/24 0559  HGB 12.4 10.5*  HCT 37.2 32.4*  WBC 9.3 16.5*  PLT 324 325    Assessment/Plan: 33 y.o. G2P1011 postpartum day # 1  1. Continue routine postpartum care  2. Infant feeding status: expressed breast milk -Lactation consult PRN for breastfeeding   3. Contraception plan: IUD  4. Acute blood loss anemia - clinically not significant .  -Hemodynamically stable and asymptomatic -Intervention: continue on oral supplementation with ferrous sulfate 325  5. Immunization status:   all immunizations up to date  6. Elevated BPs - Procardia 30mg  every day ordered to start 12/3 Vitals:   01/19/24 1445 01/19/24 1449 01/19/24 1503 01/19/24 1544  BP: (!) 88/63 (!) 113/92 (!) 129/94 116/78   01/19/24 1715 01/19/24 1823 01/19/24 2001 01/19/24 2002  BP: (!) 142/93 129/88 (!) 139/96 (!) 131/91   01/19/24 2314 01/20/24 0322 01/20/24 0835 01/20/24 1120  BP: 130/86 (!) 134/93 117/89 (!) 133/98      Disposition: Continue inpatient postpartum care    LOS: 1 day   DELON COE, CNM 01/20/2024, 2:09 PM

## 2024-01-21 DIAGNOSIS — Z98891 History of uterine scar from previous surgery: Secondary | ICD-10-CM

## 2024-01-21 LAB — RHOGAM INJECTION: Unit division: 0

## 2024-01-21 MED ORDER — ACETAMINOPHEN 500 MG PO TABS: 500.0000 mg | ORAL_TABLET | Freq: Four times a day (QID) | ORAL | Status: AC | PRN

## 2024-01-21 MED ORDER — NIFEDIPINE ER 30 MG PO TB24: 30.0000 mg | ORAL_TABLET | Freq: Every day | ORAL | 6 refills | Status: AC

## 2024-01-21 MED ORDER — IBUPROFEN 600 MG PO TABS
600.0000 mg | ORAL_TABLET | Freq: Four times a day (QID) | ORAL | Status: DC
  Administered 2024-01-21: 600 mg via ORAL
  Filled 2024-01-21: qty 1

## 2024-01-21 MED ORDER — OXYCODONE HCL 5 MG PO TABS: 5.0000 mg | ORAL_TABLET | ORAL | 0 refills | Status: AC | PRN

## 2024-01-21 NOTE — Plan of Care (Addendum)
 Patient discharged home with family.  Discharge instructions, when to follow up, and prescriptions reviewed with patient.  Patient verbalized understanding. Patient will be escorted out by auxiliary.   Elyn Sharps, RN 01/21/24 @1300 

## 2024-01-21 NOTE — Lactation Note (Signed)
 This note was copied from a baby's chart. Lactation Consultation Note  Patient Name: Girl Jaryiah Mehlman Unijb'd Date: 01/21/2024 Age:34 hours Reason for consult: Follow-up assessment;Other (Comment) (Discharge Education)   Maternal Data Follow up assessment and discharge education w/ 45hr old baby girl and family.  Mom stated that pumping is going good and she didn't have any questions at the time.  Feeding Mother's Current Feeding Choice: Breast Milk and Formula  Interventions Interventions: Education;CDC milk storage guidelines  Discharge Discharge Education: Engorgement and breast care;Warning signs for feeding baby;Outpatient recommendation  Education on engorgement prevention/treatment was discussed as well as breastmilk storage guidelines.  LC provided patient with a handout on breastmilk storage guidelines from [facility] outpatient lactation services phone number written on the white board in the room.  Patient verbalized understanding.  Patient also provided w/ postpartum group flyer at [facility].  Consult Status Consult Status: Complete Follow-up type: Call as needed    Salih Williamson S Stanislawa Gaffin 01/21/2024, 9:26 AM

## 2024-01-21 NOTE — Discharge Instructions (Signed)

## 2024-01-21 NOTE — Discharge Summary (Signed)
 Postpartum Discharge Summary  Patient Name: Stacy Palmer DOB: Sep 04, 1990 MRN: [id number]  Date of admission: 01/19/2024 Delivery date:01/19/2024 Delivering provider: VERDON KEEN Date of discharge: 01/21/2024  Primary OB: [facility] OFE:Ejupzwu'd last menstrual period was 04/18/2023. EDC Estimated Date of Delivery: 01/23/24 Gestational Age at Delivery: [redacted]w[redacted]d   Admitting diagnosis: Delivery by elective cesarean section [O82] Supervision of high risk pregnancy in third trimester [O09.93] Pregnancy [Z34.90] Intrauterine pregnancy: [redacted]w[redacted]d     Secondary diagnosis:   Principal Problem:   S/P cesarean section Active Problems:   Supervision of high risk pregnancy in third trimester   Pregnancy   Discharge Diagnosis: Term Pregnancy Delivered      Hospital course: Sceduled C/S   33 y.o. yo G2P1011 at [redacted]w[redacted]d was admitted to the hospital 01/19/2024 for scheduled cesarean section with the following indication:Elective Primary.Delivery details are as follows:  Membrane Rupture Time/Date: 12:21 PM,01/19/2024  Delivery Method:C-Section, Low Transverse Operative Delivery:N/A Details of operation can be found in separate operative note.  Patient had a postpartum course complicated by elevated BPs.  She is ambulating, tolerating a regular diet, passing flatus, and urinating well. Patient is discharged home in stable condition on  01/21/24        Newborn Data: Birth date:01/19/2024 Birth time:12:22 PM Gender:Female Living status:Living Apgars:9 ,9  Weight:3400 g                                              Post partum procedures:none Induction:: N/A Complications: None Delivery Type: primary cesarean section, low transverse incision Anesthesia: spinal anesthesia Placenta: spontaneous To Pathology: No   Prenatal Labs:  ABO, Rh: --/--/O NEG (12/01 1053) Antibody: NEG (12/01 1053) Rubella:  immune RPR:   neg HBsAg:   neg HIV:   neg GBS: neg 1 hr Glucola  109 Genetic  screening normal Anatomy US  normal  Magnesium Sulfate received: No BMZ received: No Rhophylac :was not indicated MMR: was not indicated Varivax vaccine given: was not indicated T-DaP:Given prenatally Flu: Given prenatally  Transfusion:No  Physical exam  Vitals:   01/20/24 1552 01/20/24 1937 01/20/24 2340 01/21/24 0334  BP: 136/81 (!) 139/98 129/81 112/73  Pulse: 99 93 80 100  Resp: 20 18 18 18   Temp: 98.1 F (36.7 C) 98.1 F (36.7 C) (!) 97.5 F (36.4 C) 98.4 F (36.9 C)  TempSrc: Oral Oral Oral Oral  SpO2:  98% 98% 97%  Weight:      Height:       General: alert, cooperative, and no distress Lochia: appropriate Uterine Fundus: firm Perineum:intact Incision: Healing well with no significant drainage, covered with occlusive OP site dressing  DVT Evaluation: No evidence of DVT seen on physical exam.  Labs: Lab Results  Component Value Date   WBC 16.5 (H) 01/20/2024   HGB 10.5 (L) 01/20/2024   HCT 32.4 (L) 01/20/2024   MCV 87.8 01/20/2024   PLT 325 01/20/2024      Latest Ref Rng & Units 01/19/2024    9:47 AM  CMP  Glucose 70 - 99 mg/dL 86   BUN 6 - 20 mg/dL 8   Creatinine 9.55 - 8.99 mg/dL 9.44   Sodium 864 - 854 mmol/L 136   Potassium 3.5 - 5.1 mmol/L 3.8   Chloride 98 - 111 mmol/L 104   CO2 22 - 32 mmol/L 20   Calcium 8.9 - 10.3 mg/dL 9.5  Total Protein 6.5 - 8.1 g/dL 6.9   Total Bilirubin 0.0 - 1.2 mg/dL 0.3   Alkaline Phos 38 - 126 U/L 245   AST 15 - 41 U/L 14   ALT 0 - 44 U/L 19    Edinburgh Score:    01/20/2024   10:07 PM  Edinburgh Postnatal Depression Scale Screening Tool  I have been able to laugh and see the funny side of things. 0  I have looked forward with enjoyment to things. 0  I have blamed myself unnecessarily when things went wrong. 0  I have been anxious or worried for no good reason. 0  I have felt scared or panicky for no good reason. 0  Things have been getting on top of me. 0  I have been so unhappy that I have had difficulty  sleeping. 0  I have felt sad or miserable. 0  I have been so unhappy that I have been crying. 0  The thought of harming myself has occurred to me. 0  Edinburgh Postnatal Depression Scale Total 0    Risk assessment for postpartum VTE and prophylactic treatment: Very high risk factors: None High risk factors: None Moderate risk factors: None  Postpartum VTE prophylaxis with LMWH not indicated  After visit meds:  Allergies as of 01/21/2024   No Known Allergies      Medication List     STOP taking these medications    ondansetron  4 MG disintegrating tablet Commonly known as: Zofran  ODT   promethazine  25 MG tablet Commonly known as: PHENERGAN        TAKE these medications    acetaminophen  500 MG tablet Commonly known as: TYLENOL  Take 1 tablet (500 mg total) by mouth every 6 (six) hours as needed.   ibuprofen  800 MG tablet Commonly known as: ADVIL  Take 1 tablet (800 mg total) by mouth every 8 (eight) hours as needed for cramping.   NIFEdipine 30 MG 24 hr tablet Commonly known as: ADALAT CC Take 1 tablet (30 mg total) by mouth daily.   oxyCODONE  5 MG immediate release tablet Commonly known as: Oxy IR/ROXICODONE  Take 1 tablet (5 mg total) by mouth every 4 (four) hours as needed for severe pain (pain score 7-10).   prenatal multivitamin Tabs tablet Take 2 tablets by mouth daily at 12 noon.       Discharge home in stable condition Infant Feeding: Bottle Infant Disposition:home with mother Discharge instruction: per After Visit Summary and Postpartum booklet. Activity: Advance as tolerated. Pelvic rest for 6 weeks.  Diet: routine diet Anticipated Birth Control: IUD Postpartum Appointment:6 weeks Additional Postpartum F/U: Incision check 1 week and BP check 2-3 days Future Appointments:No future appointments. Follow up Visit:  Follow-up Information     Verdon Keen, MD Follow up.   Specialty: Obstetrics and Gynecology Why: pt can also see Bobbette Brunswick for postop check Contact information: [address] [place] [address] [phone]         [facility]. Schedule an appointment as soon as possible for a visit on 01/25/2024.   Why: for a blood pressure check Contact information: [address] [place]  [address] [phone]                Plan:  ENA DEMARY was discharged to home in good condition. Follow-up appointment as directed.    Signed: Jenifer E Nautika Cressey 01/21/2024 7:44 AM

## 2024-01-21 NOTE — TOC Progression Note (Signed)
 Transition of Care [facility]) - Progression Note    Patient Details  Name: Stacy Palmer MRN: [id number] Date of Birth: 1990/06/05  [facility] CM/SW Contact  Corrie JINNY Ruts, LCSW Phone Number: 01/21/2024, 10:52 AM  Clinical Narrative:    Chart reviewed. Was notified from  DSS that the report was sent to [facility].   SW spoke with Camellia Cheney [place] [facility] DSS worker and he informed me that the report was screened out.                      Expected Discharge Plan and Services         Expected Discharge Date: 01/21/24                                     Social Drivers of Health (SDOH) Interventions SDOH Screenings   Food Insecurity: No Food Insecurity (01/19/2024)  Housing: Low Risk  (01/19/2024)  Transportation Needs: No Transportation Needs (01/19/2024)  Utilities: Not At Risk (01/19/2024)  Depression (PHQ2-9): Low Risk  (05/27/2023)  Financial Resource Strain: Low Risk  (07/06/2023)   Received from [facility]  Tobacco Use: Low Risk  (01/19/2024)    Readmission Risk Interventions     No data to display
